# Patient Record
Sex: Female | Born: 1935 | Race: White | Hispanic: No | Smoking: Never smoker
Health system: Southern US, Community
[De-identification: ages and names within clinical notes are randomized; demographics above are authoritative.]

## PROBLEM LIST (undated history)

## (undated) DIAGNOSIS — F32A Depression, unspecified: Secondary | ICD-10-CM

## (undated) DIAGNOSIS — F419 Anxiety disorder, unspecified: Secondary | ICD-10-CM

## (undated) DIAGNOSIS — R296 Repeated falls: Secondary | ICD-10-CM

## (undated) DIAGNOSIS — F329 Major depressive disorder, single episode, unspecified: Secondary | ICD-10-CM

## (undated) DIAGNOSIS — F0280 Dementia in other diseases classified elsewhere without behavioral disturbance: Secondary | ICD-10-CM

## (undated) DIAGNOSIS — G43909 Migraine, unspecified, not intractable, without status migrainosus: Secondary | ICD-10-CM

## (undated) DIAGNOSIS — G309 Alzheimer's disease, unspecified: Secondary | ICD-10-CM

## (undated) HISTORY — DX: Anxiety disorder, unspecified: F41.9

## (undated) HISTORY — DX: Depression, unspecified: F32.A

## (undated) HISTORY — DX: Major depressive disorder, single episode, unspecified: F32.9

## (undated) HISTORY — DX: Migraine, unspecified, not intractable, without status migrainosus: G43.909

---

## 2005-03-15 ENCOUNTER — Ambulatory Visit: Payer: Self-pay | Admitting: Internal Medicine

## 2005-03-15 ENCOUNTER — Ambulatory Visit (HOSPITAL_COMMUNITY): Admission: RE | Admit: 2005-03-15 | Discharge: 2005-03-15 | Payer: Self-pay | Admitting: Internal Medicine

## 2012-01-17 DIAGNOSIS — R112 Nausea with vomiting, unspecified: Secondary | ICD-10-CM

## 2014-04-08 ENCOUNTER — Encounter: Payer: Self-pay | Admitting: Neurology

## 2014-04-08 ENCOUNTER — Encounter (INDEPENDENT_AMBULATORY_CARE_PROVIDER_SITE_OTHER): Payer: Self-pay

## 2014-04-08 ENCOUNTER — Encounter: Payer: Self-pay | Admitting: *Deleted

## 2014-04-08 ENCOUNTER — Ambulatory Visit (INDEPENDENT_AMBULATORY_CARE_PROVIDER_SITE_OTHER): Payer: Medicare Other | Admitting: Neurology

## 2014-04-08 VITALS — BP 156/82 | HR 64 | Temp 98.0°F | Ht 65.0 in | Wt 154.0 lb

## 2014-04-08 DIAGNOSIS — N390 Urinary tract infection, site not specified: Secondary | ICD-10-CM

## 2014-04-08 DIAGNOSIS — G4700 Insomnia, unspecified: Secondary | ICD-10-CM

## 2014-04-08 DIAGNOSIS — F0390 Unspecified dementia without behavioral disturbance: Secondary | ICD-10-CM

## 2014-04-08 NOTE — Patient Instructions (Addendum)
You have complaints of memory loss: memory loss. Conditions that can contribute to subjective or objective memory loss include: depression, stress, poor sleep from insomnia or sleep apnea, dehydration, fluctuation in blood sugar values, thyroid or electrolyte dysfunction. Dementia can be causes by stroke, brain atherosclerosis and by Alzheimer's disease or other, more rare and sometimes hereditary causes. We will do some additional testing: a brain scan and will continue with namenda 5 mg.

## 2014-04-08 NOTE — Progress Notes (Signed)
Subjective:    Patient ID: Veronica Hood is a 80 y.o. female.  HPI    Huston FoleySaima Mervin Ramires, MD, PhD [facility] [address], [address] [address] [place], [place] [address]  Dear Chrissie NoaWilliam,   I saw your patient, Veronica PearDorothy Hood, upon your kind request in my neurologic clinic today for initial consultation of her memory loss. The patient is accompanied by her son and her granddaughter today. As you know, Veronica Hood is a 80 year old right-handed woman with an underlying medical history of allergic rhinitis, thyroid disease, edema, hyperlipidemia, sinusitis, vitamin D deficiency, who has had memory loss for the past 4 years. She was seen in clinic by you on 04/02/2014 at which time he started her on Namenda. She was also complaining of depression at the time. She tried Aricept about a year or 18 months ago and she had N/V, so it was stopped after one dose.  She is widowed since 2009 or 2010. Her son lives nearby. She moved in with her GD Denny Peonrin about a week ago, because of wandering. Blood work from 09/11/2013 included lipid panel which showed an LDL of 1:15, total cholesterol of 188, creatinine of 1.03, sodium of 146, ALT and AST normal. She is currently treated for a UTI.  She does not smoke. She does not drink water, in fact, drinks sweet tea and cola. She has not driven a car since 8/15.  She does not sleep well.  In the last month, she has had visual hallucinations and does not like to be alone.   She primarily has been having difficulty with short-term memory such as forgetfulness, misplacing things, asking the same question again and forgetting dates and events. There is report of confusion or disorientation. Familiar faces are easily recognized, but some long term memory issues are also reported.  She reports a FHx of AD in her father, 2 sisters and 2 brothers.  She has one son and one GD.   Her Past Medical History Is Significant For: Past Medical History  Diagnosis Date   . Migraines   . Depression   . Anxiety     Her Past Surgical History Is Significant For: No past surgical history on file.  Her Family History Is Significant For: Family History  Problem Relation Age of Onset  . Alzheimer's disease Father     Her Social History Is Significant For: History   Social History  . Marital Status: Single    Spouse Name: N/A    Number of Children: 1  . Years of Education: college   Occupational History  .      retired   Social History Main Topics  . Smoking status: Never Smoker   . Smokeless tobacco: Never Used  . Alcohol Use: No  . Drug Use: No  . Sexual Activity: None   Other Topics Concern  . None   Social History Narrative  . None    Her Allergies Are:  Allergies  Allergen Reactions  . Penicillins     swelling  :   Her Current Medications Are:  Outpatient Encounter Prescriptions as of 04/08/2014  Medication Sig  . ALPRAZolam (XANAX) 0.5 MG tablet Take 0.5 mg by mouth 3 (three) times daily as needed for anxiety. At bedtime  . ciprofloxacin (CIPRO) 500 MG/5ML (10%) suspension Take 500 mg by mouth 2 (two) times daily.  Marland Kitchen. escitalopram (LEXAPRO) 10 MG tablet Take 10 mg by mouth daily.  . memantine (NAMENDA) 5 MG tablet Take 5 mg by  mouth daily.  :  Review of Systems:  Out of a complete 14 point review of systems, all are reviewed and negative with the exception of these symptoms as listed below:    Review of Systems  HENT:       Ringing in ears  Cardiovascular: Positive for leg swelling.  Musculoskeletal:       Cramps  Neurological:       Memory loss, confusion, headache, insomnia, sleepiness, dizziness  Psychiatric/Behavioral:       Depression, anxiety, not enough sleep, suicidal thoughts, hallucinations, racing thoughts    Objective:  Neurologic Exam  Physical Exam Physical Examination:   Filed Vitals:   04/08/14 1122  BP: 156/82  Pulse: 64  Temp: 98 F (36.7 C)    General Examination: The patient is a  very pleasant 80 y.o. female in no acute distress. She is calm and cooperative with the exam. She denies Auditory Hallucinations and Visual Hallucinations. She is well groomed and situated in a chair.   HEENT: Normocephalic, atraumatic, pupils are equal, round and reactive to light and accommodation. Funduscopic exam is normal with sharp disc margins noted. Extraocular tracking shows mild saccadic breakdown without nystagmus noted. Hearing is impaired mildly. Tympanic membranes are clear bilaterally. Face is symmetric with no facial masking and normal facial sensation. There is no lip, neck or jaw tremor. Neck is not rigid with intact passive ROM. There are no carotid bruits on auscultation. Oropharynx exam reveals moderate mouth dryness. No significant airway crowding is noted. Mallampati is class III. Tongue protrudes centrally and palate elevates symmetrically.    Chest: is clear to auscultation without wheezing, rhonchi or crackles noted.  Heart: sounds are regular and normal without murmurs, rubs or gallops noted.   Abdomen: is soft, non-tender and non-distended with normal bowel sounds appreciated on auscultation.  Extremities: There is no pitting edema in the distal lower extremities bilaterally. Pedal pulses are intact.   Skin: is warm and dry with no trophic changes noted. Age-related changes are noted on the skin. Skin is very dry.   Musculoskeletal: exam reveals no obvious joint deformities, tenderness or joint swelling or erythema. Changes consistent with OA of the hands are noted bilaterally.   Neurologically:  Mental status: The patient is awake and alert, paying fair  attention. She is able to partially provide the history. Her family provides details. She is oriented to: person, situation, month of year and year. Her memory, attention, language and knowledge are impaired. There is no aphasia, agnosia, apraxia or anomia. There is a mild degree of bradyphrenia. Speech is mildly  hypophonic with no dysarthria noted. Mood is congruent and affect is blunted.  On 04/08/2014:  Her MMSE (Mini-Mental state exam) score is 24/30.  CDT (Clock Drawing Test) score is 2/4.  AFT (Animal Fluency Test) score is 11.  Geriatric Depression Scale Score is 2/15.   Cranial nerves are as described above under HEENT exam. In addition, shoulder shrug is normal with equal shoulder height noted.  Motor exam: Normal bulk, and strength for age is noted. Tone is not rigid with absence of cogwheeling in the  extremities. There is overall mild bradykinesia. There is no drift or rebound. There is no tremor.  Romberg is negative. Reflexes are 1+ in the upper extremities and trace in the lower extremities. Toes are downgoing bilaterally. Fine motor skills: Finger taps, hand movements, and rapid alternating patting are mildly impaired bilaterally. Foot taps and foot agility are mildly impaired bilaterally.   Cerebellar testing  shows no dysmetria or intention tremor on finger to nose testing. Heel to shin is unremarkable. There is no truncal or gait ataxia.   Sensory exam is intact to light touch, pinprick, vibration, temperature sense in the upper and lower extremities.   Gait, station and balance: She stands up from the seated position with mild difficulty and needs no assistance. No veering to one side is noted. No leaning to one side. Posture is age-appropriate. Stance is narrow-based. She turns in 3 steps. Tandem walk is not tested. Balance is not significantly impaired.   Assessment and Plan:   In summary, Veronica Hood is a very pleasant 80 y.o.-year old female  with an underlying medical history of allergic rhinitis, thyroid disease, edema, hyperlipidemia, sinusitis, vitamin D deficiency, who has had memory loss for the past 4 years. Her MMSE today is 24/30. She previously tried Aricept. She could not tolerate it but only took one pill as I understand. She may be able to try it again. As of right  now she is on Namenda 5 mg and has been able to tolerated. 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 She may have Alzheimer's disease. She had an MRI about a year and half ago at [facility]. We will try to get those records. We will repeat her brain MRI without contrast. I asked her to reduce her caffeine intake and be better hydrated with water. We talked about the diagnosis of memory loss and dementia, its prognosis and treatment options. Implications of diagnosis explained at length with the patient and caregivers. We talked about medical treatments and non-pharmacological approaches. We talked about maintaining a healthy lifestyle in general and staying active mentally and physically. I encouraged the patient to eat healthy, exercise daily and keep well hydrated, to keep a scheduled bedtime and wake time routine, to not skip any meals and eat healthy snacks in between meals and to have protein with every meal. I stressed the importance of regular exercise, within of course the patient's own mobility limitations. I encouraged the patient to keep up with current events by reading the news paper or watching the news and to do word puzzles, or if feasible, to go on [email or web address].   As far as further diagnostic testing is concerned, I suggested the following: no change. MRI brain without contrast. She has had routine blood work with you. Please make sure she is had a TSH and B12 level checked within the past year. As far as medications are concerned, I recommended the following at this time: I suggested she continue with Namenda 5 mg. She may be able to increase it to 10 mg soon. I do agree that she lives with someone for better supervision. I answered all their questions today and the patient and her family were in agreement with the above outlined plan. I would like to see the patient back in 3 months, sooner if the need arises and encouraged them to call with any interim questions,  concerns, problems, updates.   Thank you very much for allowing me to participate in the care of this nice patient. If I can be of any further assistance to you please do not hesitate to call me at [phone].  Sincerely,   Huston FoleySaima Madylin Fairbank, MD, PhD

## 2014-04-29 ENCOUNTER — Ambulatory Visit
Admission: RE | Admit: 2014-04-29 | Discharge: 2014-04-29 | Disposition: A | Discharge status: Home / Self Care | Payer: Medicare Other | Source: Ambulatory Visit | Attending: Neurology | Admitting: Neurology

## 2014-04-29 DIAGNOSIS — F0390 Unspecified dementia without behavioral disturbance: Secondary | ICD-10-CM

## 2014-04-29 DIAGNOSIS — G4700 Insomnia, unspecified: Secondary | ICD-10-CM

## 2014-04-29 DIAGNOSIS — N390 Urinary tract infection, site not specified: Secondary | ICD-10-CM

## 2014-05-01 NOTE — Progress Notes (Signed)
Quick Note:  Please call patient regarding the recent brain MRI: The brain scan showed a normal structure of the brain and mild volume loss which we call atrophy. There were changes in the deeper structures of the brain, which we call white matter changes or microvascular changes. These were reported as mild in Her case. These are tiny white spots, that occur with time and are seen in a variety of conditions, including with normal aging, chronic hypertension, chronic headaches, especially migraine HAs, chronic diabetes, chronic hyperlipidemia. These are not strokes and no mass or lesion or contrast enhancement was seen which is reassuring. Again, there were no acute findings, such as a stroke, or mass or blood products.  Overall, findings were felt to be age-appropriate by the reading physician, which is reassuring.   No further action is required on this test at this time, other than re-enforcing the importance of good blood pressure control, good cholesterol control, good blood sugar control, and weight management. Please remind patient to keep any upcoming appointments or tests and to call us with any interim questions, concerns, problems or updates. Thanks,  Huston FoleySaima Chavis Tessler, MD, PhD    ______

## 2014-05-02 ENCOUNTER — Telehealth: Payer: Self-pay | Admitting: Neurology

## 2014-05-02 DIAGNOSIS — F0390 Unspecified dementia without behavioral disturbance: Secondary | ICD-10-CM

## 2014-05-02 MED ORDER — MEMANTINE HCL ER 14 MG PO CP24: 14.0000 mg | ORAL_CAPSULE | Freq: Every day | ORAL | Status: DC

## 2014-05-02 NOTE — Telephone Encounter (Signed)
Veronica Hood (granddaughter) said they would like further testing done since MR Brain results were normal. She will put purse down and 5 sec. Later does not remember were it is. Does not remember her granddaughters are and they live with her.

## 2014-05-02 NOTE — Telephone Encounter (Signed)
Called granddaughter(Erin)but could not leave VM, left message with son that patient's Veronica Hood has been increased per Dr Teofilo PodAthar's note below, he verbalized understanding

## 2014-05-02 NOTE — Telephone Encounter (Signed)
We can go ahead and increase her Namenda. I would suggest Namenda XR 14 mg daily. Rx sent to pharmacy.

## 2014-07-09 ENCOUNTER — Telehealth: Payer: Self-pay | Admitting: Neurology

## 2014-07-09 ENCOUNTER — Ambulatory Visit: Payer: Medicare Other | Admitting: Neurology

## 2014-07-09 NOTE — Telephone Encounter (Signed)
Patient is a no show for today's appointment 07/09/14 

## 2015-07-29 ENCOUNTER — Other Ambulatory Visit (HOSPITAL_COMMUNITY)
Admission: RE | Admit: 2015-07-29 | Discharge: 2015-07-29 | Disposition: A | Discharge status: Home / Self Care | Payer: Medicare Other | Source: Skilled Nursing Facility | Attending: Family Medicine | Admitting: Family Medicine

## 2015-07-29 DIAGNOSIS — N390 Urinary tract infection, site not specified: Secondary | ICD-10-CM | POA: Insufficient documentation

## 2015-07-29 LAB — URINE MICROSCOPIC-ADD ON: RBC / HPF: NONE SEEN RBC/hpf (ref 0–5)

## 2015-07-29 LAB — URINALYSIS, ROUTINE W REFLEX MICROSCOPIC
Bilirubin Urine: NEGATIVE
GLUCOSE, UA: NEGATIVE mg/dL
HGB URINE DIPSTICK: NEGATIVE
Ketones, ur: NEGATIVE mg/dL
Nitrite: POSITIVE — AB
PROTEIN: NEGATIVE mg/dL
Specific Gravity, Urine: 1.015 (ref 1.005–1.030)
pH: 6 (ref 5.0–8.0)

## 2015-08-01 LAB — URINE CULTURE: Culture: >=100000

## 2015-10-19 ENCOUNTER — Other Ambulatory Visit (HOSPITAL_COMMUNITY)
Admission: RE | Admit: 2015-10-19 | Discharge: 2015-10-19 | Disposition: A | Discharge status: Home / Self Care | Payer: Medicare Other | Source: Skilled Nursing Facility | Attending: *Deleted | Admitting: *Deleted

## 2015-10-19 DIAGNOSIS — N390 Urinary tract infection, site not specified: Secondary | ICD-10-CM | POA: Diagnosis present

## 2015-10-19 LAB — URINE MICROSCOPIC-ADD ON: RBC / HPF: NONE SEEN RBC/hpf (ref 0–5)

## 2015-10-19 LAB — URINALYSIS, ROUTINE W REFLEX MICROSCOPIC
BILIRUBIN URINE: NEGATIVE
GLUCOSE, UA: NEGATIVE mg/dL
HGB URINE DIPSTICK: NEGATIVE
Ketones, ur: NEGATIVE mg/dL
Nitrite: NEGATIVE
PROTEIN: NEGATIVE mg/dL
SPECIFIC GRAVITY, URINE: 1.02 (ref 1.005–1.030)
pH: 5.5 (ref 5.0–8.0)

## 2015-10-22 LAB — URINE CULTURE

## 2016-02-11 ENCOUNTER — Other Ambulatory Visit: Payer: Self-pay

## 2016-02-11 ENCOUNTER — Emergency Department (HOSPITAL_COMMUNITY): Payer: Medicare Other

## 2016-02-11 ENCOUNTER — Observation Stay (HOSPITAL_COMMUNITY)
Admission: EM | Admit: 2016-02-11 | Discharge: 2016-02-12 | Disposition: A | Discharge status: Home / Self Care | Payer: Medicare Other | Attending: Internal Medicine | Admitting: Internal Medicine

## 2016-02-11 ENCOUNTER — Encounter (HOSPITAL_COMMUNITY): Payer: Self-pay

## 2016-02-11 DIAGNOSIS — Y92129 Unspecified place in nursing home as the place of occurrence of the external cause: Secondary | ICD-10-CM | POA: Diagnosis not present

## 2016-02-11 DIAGNOSIS — J189 Pneumonia, unspecified organism: Secondary | ICD-10-CM | POA: Diagnosis present

## 2016-02-11 DIAGNOSIS — S0083XA Contusion of other part of head, initial encounter: Secondary | ICD-10-CM | POA: Diagnosis not present

## 2016-02-11 DIAGNOSIS — R0902 Hypoxemia: Secondary | ICD-10-CM

## 2016-02-11 DIAGNOSIS — R4689 Other symptoms and signs involving appearance and behavior: Secondary | ICD-10-CM | POA: Diagnosis present

## 2016-02-11 DIAGNOSIS — Y998 Other external cause status: Secondary | ICD-10-CM | POA: Insufficient documentation

## 2016-02-11 DIAGNOSIS — W19XXXA Unspecified fall, initial encounter: Secondary | ICD-10-CM | POA: Insufficient documentation

## 2016-02-11 DIAGNOSIS — Z79899 Other long term (current) drug therapy: Secondary | ICD-10-CM | POA: Diagnosis not present

## 2016-02-11 DIAGNOSIS — R4182 Altered mental status, unspecified: Secondary | ICD-10-CM | POA: Diagnosis not present

## 2016-02-11 DIAGNOSIS — Y939 Activity, unspecified: Secondary | ICD-10-CM | POA: Insufficient documentation

## 2016-02-11 HISTORY — DX: Dementia in other diseases classified elsewhere, unspecified severity, without behavioral disturbance, psychotic disturbance, mood disturbance, and anxiety: F02.80

## 2016-02-11 HISTORY — DX: Alzheimer's disease, unspecified: G30.9

## 2016-02-11 LAB — COMPREHENSIVE METABOLIC PANEL
ALT: 14 U/L (ref 14–54)
AST: 20 U/L (ref 15–41)
Albumin: 3.9 g/dL (ref 3.5–5.0)
Alkaline Phosphatase: 60 U/L (ref 38–126)
Anion gap: 7 (ref 5–15)
BUN: 22 mg/dL — ABNORMAL HIGH (ref 6–20)
CHLORIDE: 109 mmol/L (ref 101–111)
CO2: 22 mmol/L (ref 22–32)
CREATININE: 1.12 mg/dL — AB (ref 0.44–1.00)
Calcium: 8.6 mg/dL — ABNORMAL LOW (ref 8.9–10.3)
GFR calc non Af Amer: 44 mL/min — ABNORMAL LOW (ref >60)
GFR, EST AFRICAN AMERICAN: 51 mL/min — AB (ref >60)
Glucose, Bld: 107 mg/dL — ABNORMAL HIGH (ref 65–99)
Potassium: 3.6 mmol/L (ref 3.5–5.1)
SODIUM: 138 mmol/L (ref 135–145)
Total Bilirubin: 0.7 mg/dL (ref 0.3–1.2)
Total Protein: 5.8 g/dL — ABNORMAL LOW (ref 6.5–8.1)

## 2016-02-11 LAB — URINALYSIS, ROUTINE W REFLEX MICROSCOPIC
BILIRUBIN URINE: NEGATIVE
Glucose, UA: NEGATIVE mg/dL
Hgb urine dipstick: NEGATIVE
Leukocytes, UA: NEGATIVE
NITRITE: NEGATIVE
PH: 5.5 (ref 5.0–8.0)
Protein, ur: NEGATIVE mg/dL
SPECIFIC GRAVITY, URINE: 1.02 (ref 1.005–1.030)

## 2016-02-11 LAB — CBC WITH DIFFERENTIAL/PLATELET
BASOS PCT: 0 %
Basophils Absolute: 0 10*3/uL (ref 0.0–0.1)
EOS ABS: 0.1 10*3/uL (ref 0.0–0.7)
Eosinophils Relative: 1 %
HCT: 33.9 % — ABNORMAL LOW (ref 36.0–46.0)
Hemoglobin: 11.4 g/dL — ABNORMAL LOW (ref 12.0–15.0)
Lymphocytes Relative: 56 %
Lymphs Abs: 6.3 10*3/uL — ABNORMAL HIGH (ref 0.7–4.0)
MCH: 31.2 pg (ref 26.0–34.0)
MCHC: 33.6 g/dL (ref 30.0–36.0)
MCV: 92.9 fL (ref 78.0–100.0)
Monocytes Absolute: 0.6 10*3/uL (ref 0.1–1.0)
Monocytes Relative: 5 %
NEUTROS PCT: 37 %
Neutro Abs: 4.2 10*3/uL (ref 1.7–7.7)
PLATELETS: 185 10*3/uL (ref 150–400)
RBC: 3.65 MIL/uL — AB (ref 3.87–5.11)
RDW: 14.5 % (ref 11.5–15.5)
WBC: 11.3 10*3/uL — AB (ref 4.0–10.5)

## 2016-02-11 MED ORDER — SODIUM CHLORIDE 0.9 % IV BOLUS (SEPSIS)
1000.0000 mL | Freq: Once | INTRAVENOUS | Status: AC
  Administered 2016-02-11: 1000 mL via INTRAVENOUS

## 2016-02-11 NOTE — ED Notes (Addendum)
Patient's son Veronica Hood called and requested and update on patient. States to call him if needed. Number is [phone]. Son states patient had bruising to left eye and forehead on Monday when he was visiting patient at [facility]. States he was told by staff that patient had fallen.

## 2016-02-11 NOTE — ED Provider Notes (Signed)
[facility]-EMERGENCY DEPT Provider Note   CSN: [id number] Arrival date & time: 02/11/16  2032  By signing my name below, I, Christy SartoriusAnastasia Kolousek, attest that this documentation has been prepared under the direction and in the presence of Bethann BerkshireJoseph Annaclaire Walsworth, MD . Electronically Signed: Christy SartoriusAnastasia Kolousek, Scribe. 02/11/2016. 9:12 PM.  History   Chief Complaint Chief Complaint  Patient presents with  . Fall   Level 5 Caveat: Altered Mental Status   Patient was sent over from the nursing home because she has been acting out for the last week or so. Patient was given a lot of Ativan today and was sent over here for evaluation. Patient had a fall 2 days ago   The history is provided by the patient. The history is limited by the condition of the patient. No language interpreter was used.  Fall  This is a new problem. The current episode started 2 days ago. The problem occurs rarely. The problem has been resolved. Nothing aggravates the symptoms. Nothing relieves the symptoms. She has tried nothing for the symptoms.   HPI Comments:  Veronica Hood is a 82 y.o. female who presents to the Emergency Department complaining of behavioral issues.  Her behavioral issues have continued at the same time every day for 2 weeks.  She was given lorazepam PRN as well as her regular dose.  The caretakers state that they didn't notice the bruising on her face until today and deny known trauma.  Pt fell earlier today; no head injury was reported.   Past Medical History:  Diagnosis Date  . Anxiety   . Depression   . Migraines     There are no active problems to display for this patient.   History reviewed. No pertinent surgical history.  OB History    No data available       Home Medications    Prior to Admission medications   Medication Sig Start Date End Date Taking? Authorizing Provider  ALPRAZolam Prudy Feeler(XANAX) 0.5 MG tablet Take 0.5 mg by mouth 3 (three) times daily as needed for anxiety. At bedtime     Historical Provider, MD  ciprofloxacin (CIPRO) 500 MG/5ML (10%) suspension Take 500 mg by mouth 2 (two) times daily.    Historical Provider, MD  escitalopram (LEXAPRO) 10 MG tablet Take 10 mg by mouth daily.    Historical Provider, MD  memantine (NAMENDA) 5 MG tablet Take 5 mg by mouth daily.    Historical Provider, MD  Memantine HCl ER (NAMENDA XR) 14 MG CP24 Take 1 tablet (14 mg total) by mouth daily. 05/02/14   Huston FoleySaima Athar, MD    Family History Family History  Problem Relation Age of Onset  . Alzheimer's disease Father     Social History Social History  Substance Use Topics  . Smoking status: Never Smoker  . Smokeless tobacco: Never Used  . Alcohol use No     Allergies   Penicillins   Review of Systems Review of Systems  Unable to perform ROS: Mental status change     Physical Exam Updated Vital Signs BP 126/64 (BP Location: Left Arm)   Pulse 68   Temp 97.4 F (36.3 C) (Axillary)   Resp 20   Wt 154 lb (69.9 kg)   SpO2 100%   BMI 25.63 kg/m   Physical Exam  Constitutional: She appears well-developed.  Responds to painful stimuli.  Very lethargic.    HENT:  Head: Normocephalic.  Eyes: Conjunctivae and EOM are normal. No scleral icterus.  Neck: Neck  supple. No tracheal deviation present. No thyromegaly present.  Cardiovascular: Normal rate and regular rhythm.  Exam reveals no gallop and no friction rub.   No murmur heard. Pulmonary/Chest: No stridor. She has no wheezes. She has no rales. She exhibits no tenderness.  Abdominal: She exhibits no distension. There is no tenderness. There is no rebound.  Musculoskeletal: Normal range of motion. She exhibits no edema.  Lymphadenopathy:    She has no cervical adenopathy.  Neurological: She exhibits normal muscle tone. Coordination normal.  Skin: Skin is warm. No rash noted. No erythema.  Bruising on her left eye and forehead.   Psychiatric: She has a normal mood and affect. Her behavior is normal.     ED  Treatments / Results   DIAGNOSTIC STUDIES:  Oxygen Saturation is 100% on RA, nml by my interpretation.    COORDINATION OF CARE:  9:12 PM Discussed treatment plan with pt at bedside and pt agreed to plan.  Labs (all labs ordered are listed, but only abnormal results are displayed) Labs Reviewed  CBC WITH DIFFERENTIAL/PLATELET  COMPREHENSIVE METABOLIC PANEL  URINALYSIS, ROUTINE W REFLEX MICROSCOPIC (NOT AT [facility])    EKG  EKG Interpretation None       Radiology No results found.  Procedures Procedures (including critical care time)  Medications Ordered in ED Medications  sodium chloride 0.9 % bolus 1,000 mL (not administered)     Initial Impression / Assessment and Plan / ED Course  I have reviewed the triage vital signs and the nursing notes.  Pertinent labs & imaging results that were available during my care of the patient were reviewed by me and considered in my medical decision making (see chart for details).  Clinical Course   Patient with confusion and bruising to her head. She will go to [facility] to get a CT scan of her head neck and face.  If the CT scan is negative for trauma she will return [facility] department [facility] to be admitted for altered mental status most likely secondary to Ativan   Final Clinical Impressions(s) / ED Diagnoses   Final diagnoses:  None    New Prescriptions New Prescriptions   No medications on file       Bethann BerkshireJoseph Mahaley Schwering, MD 02/11/16 2335
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
## 2016-02-11 NOTE — ED Notes (Signed)
[facility] at [facility] to [facility] to [facility] for [facility] scan.

## 2016-02-11 NOTE — ED Triage Notes (Signed)
Pt is resident of [facility] where pt apparently fell yesterday, has bruising to her face and pain in her back.  Ems was called out for "combative" pt. Pt was cooperative for ems

## 2016-02-12 ENCOUNTER — Emergency Department (HOSPITAL_COMMUNITY): Payer: Medicare Other

## 2016-02-12 ENCOUNTER — Ambulatory Visit (HOSPITAL_COMMUNITY)
Admission: RE | Admit: 2016-02-12 | Discharge: 2016-02-12 | Disposition: A | Discharge status: Home / Self Care | Payer: Medicare Other | Source: Ambulatory Visit | Attending: Emergency Medicine | Admitting: Emergency Medicine

## 2016-02-12 ENCOUNTER — Encounter (HOSPITAL_COMMUNITY): Payer: Self-pay | Admitting: Emergency Medicine

## 2016-02-12 DIAGNOSIS — J189 Pneumonia, unspecified organism: Secondary | ICD-10-CM | POA: Diagnosis not present

## 2016-02-12 DIAGNOSIS — R4182 Altered mental status, unspecified: Secondary | ICD-10-CM

## 2016-02-12 LAB — CBC
HCT: 32.3 % — ABNORMAL LOW (ref 36.0–46.0)
HEMOGLOBIN: 10.9 g/dL — AB (ref 12.0–15.0)
MCH: 31.4 pg (ref 26.0–34.0)
MCHC: 33.7 g/dL (ref 30.0–36.0)
MCV: 93.1 fL (ref 78.0–100.0)
PLATELETS: 174 10*3/uL (ref 150–400)
RBC: 3.47 MIL/uL — AB (ref 3.87–5.11)
RDW: 14.5 % (ref 11.5–15.5)
WBC: 9.3 10*3/uL (ref 4.0–10.5)

## 2016-02-12 LAB — COMPREHENSIVE METABOLIC PANEL
ALBUMIN: 3.5 g/dL (ref 3.5–5.0)
ALK PHOS: 52 U/L (ref 38–126)
ALT: 13 U/L — AB (ref 14–54)
ANION GAP: 5 (ref 5–15)
AST: 18 U/L (ref 15–41)
BUN: 19 mg/dL (ref 6–20)
CHLORIDE: 112 mmol/L — AB (ref 101–111)
CO2: 24 mmol/L (ref 22–32)
Calcium: 8.5 mg/dL — ABNORMAL LOW (ref 8.9–10.3)
Creatinine, Ser: 0.93 mg/dL (ref 0.44–1.00)
GFR calc non Af Amer: 55 mL/min — ABNORMAL LOW (ref >60)
GLUCOSE: 93 mg/dL (ref 65–99)
Potassium: 3.4 mmol/L — ABNORMAL LOW (ref 3.5–5.1)
SODIUM: 141 mmol/L (ref 135–145)
Total Bilirubin: 0.6 mg/dL (ref 0.3–1.2)
Total Protein: 5.1 g/dL — ABNORMAL LOW (ref 6.5–8.1)

## 2016-02-12 LAB — MRSA PCR SCREENING: MRSA by PCR: NEGATIVE

## 2016-02-12 LAB — CBG MONITORING, ED: GLUCOSE-CAPILLARY: 93 mg/dL (ref 65–99)

## 2016-02-12 MED ORDER — FAMOTIDINE 20 MG PO TABS
20.0000 mg | ORAL_TABLET | Freq: Every day | ORAL | Status: DC
  Filled 2016-02-12: qty 1

## 2016-02-12 MED ORDER — DOCUSATE SODIUM 100 MG PO CAPS
100.0000 mg | ORAL_CAPSULE | Freq: Two times a day (BID) | ORAL | Status: DC
  Filled 2016-02-12: qty 1

## 2016-02-12 MED ORDER — QUETIAPINE FUMARATE 25 MG PO TABS: 50.0000 mg | ORAL_TABLET | Freq: Every day | ORAL | Status: DC

## 2016-02-12 MED ORDER — SODIUM CHLORIDE 0.9 % IV SOLN
INTRAVENOUS | Status: DC
  Administered 2016-02-12: 03:00:00 via INTRAVENOUS

## 2016-02-12 MED ORDER — DEXTROSE 5 % IV SOLN
2.0000 g | Freq: Three times a day (TID) | INTRAVENOUS | Status: DC
  Administered 2016-02-12: 2 g via INTRAVENOUS
  Filled 2016-02-12 (×11): qty 2

## 2016-02-12 MED ORDER — ENOXAPARIN SODIUM 40 MG/0.4ML ~~LOC~~ SOLN
40.0000 mg | SUBCUTANEOUS | Status: DC
  Filled 2016-02-12: qty 0.4

## 2016-02-12 MED ORDER — ESCITALOPRAM OXALATE 10 MG PO TABS
10.0000 mg | ORAL_TABLET | Freq: Every day | ORAL | Status: DC
  Filled 2016-02-12: qty 1

## 2016-02-12 MED ORDER — ACETAMINOPHEN 500 MG PO TABS
1000.0000 mg | ORAL_TABLET | Freq: Two times a day (BID) | ORAL | Status: DC
  Filled 2016-02-12: qty 2

## 2016-02-12 MED ORDER — BENZONATATE 100 MG PO CAPS: 100.0000 mg | ORAL_CAPSULE | Freq: Two times a day (BID) | ORAL | Status: DC | PRN

## 2016-02-12 MED ORDER — DEXTROSE 5 % IV SOLN
INTRAVENOUS | Status: AC
  Filled 2016-02-12: qty 2

## 2016-02-12 MED ORDER — FAMOTIDINE 20 MG PO TABS: 20.0000 mg | ORAL_TABLET | Freq: Two times a day (BID) | ORAL | Status: DC

## 2016-02-12 MED ORDER — GABAPENTIN 100 MG PO CAPS
100.0000 mg | ORAL_CAPSULE | Freq: Two times a day (BID) | ORAL | Status: DC
  Filled 2016-02-12: qty 1

## 2016-02-12 MED ORDER — VANCOMYCIN HCL IN DEXTROSE 1-5 GM/200ML-% IV SOLN
1000.0000 mg | Freq: Once | INTRAVENOUS | Status: AC
  Administered 2016-02-12: 1000 mg via INTRAVENOUS
  Filled 2016-02-12: qty 200

## 2016-02-12 MED ORDER — VANCOMYCIN HCL IN DEXTROSE 750-5 MG/150ML-% IV SOLN
750.0000 mg | Freq: Two times a day (BID) | INTRAVENOUS | Status: DC
  Filled 2016-02-12 (×6): qty 150

## 2016-02-12 MED ORDER — CETYLPYRIDINIUM CHLORIDE 0.05 % MT LIQD: 7.0000 mL | Freq: Two times a day (BID) | OROMUCOSAL | Status: DC

## 2016-02-12 NOTE — H&P (Signed)
TRH H&P   Patient Demographics:    Veronica Hood, is a 82 y.o. female  MRN: [id number]  DOB - 07/11/1929  Admit Date - 02/11/2016  Outpatient Primary MD for the patient is No PCP Per Patient  Referring MD/NP/PA: Dr. Elesa Massed  Patient coming from: Skilled facility  Chief Complaint  Patient presents with  . Fall      HPI:    Veronica Hood  is a 82 y.o. female, Was sent to ED from skilled facility due to altered mental status. Patient has history of dementia and has been having behavior issues and his care facility where she was given lorazepam when necessary in addition to her regular dose. Patient apparently fell at the facility, and was sent to [facility]. CT head, cervical spine and maxillofacial without contrast which were all negative for acute abnormality. Chest x-ray showed linear and patchy opacity, may be atelectasis, pneumonia. Patient becomes hypoxic on sleeping, currently on oxygen via nonrebreather.    Review of systems:    Review of systems is not obtainable due to patient's altered mental status  A full 10 point Review of Systems was done, except as stated above, all other Review of Systems were negative.   With Past History of the following :    Past Medical History:  Diagnosis Date  . Anxiety   . Depression   . Migraines       History reviewed. No pertinent surgical history.    Social History:     Social History  Substance Use Topics  . Smoking status: Never Smoker  . Smokeless tobacco: Never Used  . Alcohol use No        Family History :     Family History  Problem Relation Age of Onset  . Alzheimer's disease Father       Home Medications:   Prior to Admission medications   Medication Sig Start Date End Date Taking? Authorizing Provider  acetaminophen (TYLENOL) 500 MG tablet Take 1,000 mg by mouth 2 (two) times daily.   Yes Historical  Provider, MD  benzonatate (TESSALON) 100 MG capsule Take 100 mg by mouth 2 (two) times daily as needed for cough.   Yes Historical Provider, MD  cholecalciferol (VITAMIN D) 1000 units tablet Take 1,000 Units by mouth daily.   Yes Historical Provider, MD  Cranberry 450 MG TABS Take 1 tablet by mouth daily.   Yes Historical Provider, MD  docusate sodium (COLACE) 100 MG capsule Take 100 mg by mouth 2 (two) times daily.   Yes Historical Provider, MD  escitalopram (LEXAPRO) 20 MG tablet Take 10 mg by mouth daily.   Yes Historical Provider, MD  gabapentin (NEURONTIN) 100 MG capsule Take 100 mg by mouth 2 (two) times daily.   Yes Historical Provider, MD  ibuprofen (ADVIL,MOTRIN) 600 MG tablet Take 600 mg by mouth daily as needed for headache.   Yes Historical Provider, MD  LORazepam (ATIVAN) 0.5 MG tablet Take 0.5 mg by mouth 3 (three) times daily. *May take  one-half tablet twice daily as needed for anxiety   Yes Historical Provider, MD  Melatonin 3 MG TABS Take 1 tablet by mouth at bedtime.   Yes Historical Provider, MD  Phenylephrine-DM-GG (ROBITUSSIN COUGH/COLD CF) 5-10-100 MG/5ML LIQD Take 20 mLs by mouth 3 (three) times daily as needed (for cough).   Yes Historical Provider, MD  QUEtiapine (SEROQUEL) 50 MG tablet Take 50 mg by mouth at bedtime.   Yes Historical Provider, MD  ranitidine (ZANTAC) 150 MG tablet Take 150 mg by mouth 2 (two) times daily.   Yes Historical Provider, MD  traZODone (DESYREL) 50 MG tablet Take 25 mg by mouth at bedtime.   Yes Historical Provider, MD  ciprofloxacin (CIPRO) 500 MG/5ML (10%) suspension Take 500 mg by mouth 2 (two) times daily.    Historical Provider, MD     Allergies:     Allergies  Allergen Reactions  . Sulfa Antibiotics Other (See Comments)    Lesions in her mouth  . Penicillins     swelling     Physical Exam:   Vitals  Blood pressure 140/70, pulse (!) 59, temperature (!) 96.1 F (35.6 C), temperature source Rectal, resp. rate 15, weight 69.9 kg  (154 lb), SpO2 97 %.   1. General Elderly female lying in bed, lethargic  2. Somnolent, opens eyes to verbal stimuli  3. No F.N deficits, ALL C.Nerves Intact, Strength 5/5 all 4 extremities, Sensation intact all 4 extremities, Plantars down going.  4. Ears and Eyes appear Normal, Conjunctivae clear, PERRLA. Moist Oral Mucosa.  5. Supple Neck, No JVD, No cervical lymphadenopathy appriciated, No Carotid Bruits.  6. Symmetrical Chest wall movement, Good air movement bilaterally, CTAB.  7. RRR, No Gallops, Rubs or Murmurs, No Parasternal Heave.No Leg edema  8. Positive Bowel Sounds, Abdomen Soft, No tenderness, No organomegaly appriciated,No rebound -guarding or rigidity.  9.  No Cyanosis, Normal Skin Turgor, No Skin Rash or Bruise.  10. Good muscle tone,  joints appear normal , no effusions, Normal ROM.      Data Review:    CBC  Recent Labs Lab 02/11/16 2210  WBC 11.3*  HGB 11.4*  HCT 33.9*  PLT 185  MCV 92.9  MCH 31.2  MCHC 33.6  RDW 14.5  LYMPHSABS 6.3*  MONOABS 0.6  EOSABS 0.1  BASOSABS 0.0   ------------------------------------------------------------------------------------------------------------------  Chemistries   Recent Labs Lab 02/11/16 2210  NA 138  K 3.6  CL 109  CO2 22  GLUCOSE 107*  BUN 22*  CREATININE 1.12*  CALCIUM 8.6*  AST 20  ALT 14  ALKPHOS 60  BILITOT 0.7   ------------------------------------------------------------------------------------------------------------------  ------------------------------------------------------------------------------------------------------------------ GFR: CrCl cannot be calculated (Unknown ideal weight.). Liver Function Tests:  Recent Labs Lab 02/11/16 2210  AST 20  ALT 14  ALKPHOS 60  BILITOT 0.7  PROT 5.8*  ALBUMIN 3.9   CBG:  Recent Labs Lab 02/12/16 0225  GLUCAP 93   Lipid  Profile:  --------------------------------------------------------------------------------------------------------------- Urine analysis:    Component Value Date/Time   COLORURINE YELLOW 02/11/2016 2224   APPEARANCEUR CLEAR 02/11/2016 2224   LABSPEC 1.020 02/11/2016 2224   PHURINE 5.5 02/11/2016 2224   GLUCOSEU NEGATIVE 02/11/2016 2224   HGBUR NEGATIVE 02/11/2016 2224   BILIRUBINUR NEGATIVE 02/11/2016 2224   KETONESUR TRACE (A) 02/11/2016 2224   PROTEINUR NEGATIVE 02/11/2016 2224   NITRITE NEGATIVE 02/11/2016 2224   LEUKOCYTESUR NEGATIVE 02/11/2016 2224      ----------------------------------------------------------------------------------------------------------------   Imaging Results:    Ct Head Wo Contrast  Result Date: 02/12/2016  CLINICAL DATA:  Fall with facial bruising in altered mental status. EXAM: CT HEAD WITHOUT CONTRAST CT MAXILLOFACIAL WITHOUT CONTRAST CT CERVICAL SPINE WITHOUT CONTRAST TECHNIQUE: Multidetector CT imaging of the head, cervical spine, and maxillofacial structures were performed using the standard protocol without intravenous contrast. Multiplanar CT image reconstructions of the cervical spine and maxillofacial structures were also generated. COMPARISON:  Head CT 12/29/2013 FINDINGS: CT HEAD FINDINGS No intracranial hemorrhage, mass effect, or midline shift. Generalized atrophy which is progressed from 2015 exam. Chronic small vessel ischemia also progressed. No hydrocephalus. The basilar cisterns are patent. No evidence of territorial infarct. No intracranial fluid collection. Atherosclerosis of skullbase vasculature. No calvarial fracture. Calvarium is intact. The mastoid air cells are well aerated. CT MAXILLOFACIAL FINDINGS No facial bone fracture. The orbits and globes are intact. The nasal bone, mandibles, zygomatic arches and pterygoid plates are intact. Paranasal sinuses are well-aerated without fluid level. Periodontal disease with full missing teeth.  Periapical lucency about right lower cuspid. No radiopaque foreign body or localizing soft tissue abnormality. CT CERVICAL SPINE FINDINGS No fracture or acute subluxation. The dens is intact. There are no jumped or perched facets. Vertebral body heights are preserved. There is multilevel degenerative change. Degenerative disc disease with disc space narrowing and endplate spurring N5-A2 through C6-C7. Facet arthropathy throughout. Minimal anterolisthesis of C3 on C4 and C4 on C5 appears degenerative. No prevertebral soft tissue edema. IMPRESSION: 1. No acute intracranial abnormality or calvarial fracture. Progressive atrophy and chronic small vessel ischemia from 2015 exam. 2. No acute facial bone fracture.  Periodontal disease. 3. Degenerative change throughout cervical spine without acute fracture or subluxation. Electronically Signed   By: Rubye Oaks M.D.   On: 02/12/2016 02:11   Ct Cervical Spine Wo Contrast  Result Date: 02/12/2016 CLINICAL DATA:  Fall with facial bruising in altered mental status. EXAM: CT HEAD WITHOUT CONTRAST CT MAXILLOFACIAL WITHOUT CONTRAST CT CERVICAL SPINE WITHOUT CONTRAST TECHNIQUE: Multidetector CT imaging of the head, cervical spine, and maxillofacial structures were performed using the standard protocol without intravenous contrast. Multiplanar CT image reconstructions of the cervical spine and maxillofacial structures were also generated. COMPARISON:  Head CT 12/29/2013 FINDINGS: CT HEAD FINDINGS No intracranial hemorrhage, mass effect, or midline shift. Generalized atrophy which is progressed from 2015 exam. Chronic small vessel ischemia also progressed. No hydrocephalus. The basilar cisterns are patent. No evidence of territorial infarct. No intracranial fluid collection. Atherosclerosis of skullbase vasculature. No calvarial fracture. Calvarium is intact. The mastoid air cells are well aerated. CT MAXILLOFACIAL FINDINGS No facial bone fracture. The orbits and globes are  intact. The nasal bone, mandibles, zygomatic arches and pterygoid plates are intact. Paranasal sinuses are well-aerated without fluid level. Periodontal disease with full missing teeth. Periapical lucency about right lower cuspid. No radiopaque foreign body or localizing soft tissue abnormality. CT CERVICAL SPINE FINDINGS No fracture or acute subluxation. The dens is intact. There are no jumped or perched facets. Vertebral body heights are preserved. There is multilevel degenerative change. Degenerative disc disease with disc space narrowing and endplate spurring Z3-Y8 through C6-C7. Facet arthropathy throughout. Minimal anterolisthesis of C3 on C4 and C4 on C5 appears degenerative. No prevertebral soft tissue edema. IMPRESSION: 1. No acute intracranial abnormality or calvarial fracture. Progressive atrophy and chronic small vessel ischemia from 2015 exam. 2. No acute facial bone fracture.  Periodontal disease. 3. Degenerative change throughout cervical spine without acute fracture or subluxation. Electronically Signed   By: Rubye Oaks M.D.   On: 02/12/2016 02:11   Dg  Chest Portable 1 View  Result Date: 02/11/2016 CLINICAL DATA:  Altered mental status. EXAM: PORTABLE CHEST 1 VIEW COMPARISON:  Radiograph 05/26/2014 FINDINGS: Lung volumes are low. Heart size and mediastinal contours are unchanged allowing for differences in technique. There is tortuosity and atherosclerosis of thoracic aorta. Linear and patchy right lung base opacity. Minimal left infrahilar atelectasis. No pleural effusion or pneumothorax. Calcified breast implants are noted. IMPRESSION: 1. Linear and patchy right basilar opacity, may be atelectasis, pneumonia, or aspiration. 2. Tortuosity and atherosclerosis of the thoracic aorta. Electronically Signed   By: Rubye Oaks M.D.   On: 02/11/2016 22:19   Ct Maxillofacial Wo Contrast  Result Date: 02/12/2016 CLINICAL DATA:  Fall with facial bruising in altered mental status. EXAM: CT  HEAD WITHOUT CONTRAST CT MAXILLOFACIAL WITHOUT CONTRAST CT CERVICAL SPINE WITHOUT CONTRAST TECHNIQUE: Multidetector CT imaging of the head, cervical spine, and maxillofacial structures were performed using the standard protocol without intravenous contrast. Multiplanar CT image reconstructions of the cervical spine and maxillofacial structures were also generated. COMPARISON:  Head CT 12/29/2013 FINDINGS: CT HEAD FINDINGS No intracranial hemorrhage, mass effect, or midline shift. Generalized atrophy which is progressed from 2015 exam. Chronic small vessel ischemia also progressed. No hydrocephalus. The basilar cisterns are patent. No evidence of territorial infarct. No intracranial fluid collection. Atherosclerosis of skullbase vasculature. No calvarial fracture. Calvarium is intact. The mastoid air cells are well aerated. CT MAXILLOFACIAL FINDINGS No facial bone fracture. The orbits and globes are intact. The nasal bone, mandibles, zygomatic arches and pterygoid plates are intact. Paranasal sinuses are well-aerated without fluid level. Periodontal disease with full missing teeth. Periapical lucency about right lower cuspid. No radiopaque foreign body or localizing soft tissue abnormality. CT CERVICAL SPINE FINDINGS No fracture or acute subluxation. The dens is intact. There are no jumped or perched facets. Vertebral body heights are preserved. There is multilevel degenerative change. Degenerative disc disease with disc space narrowing and endplate spurring Q6-V7 through C6-C7. Facet arthropathy throughout. Minimal anterolisthesis of C3 on C4 and C4 on C5 appears degenerative. No prevertebral soft tissue edema. IMPRESSION: 1. No acute intracranial abnormality or calvarial fracture. Progressive atrophy and chronic small vessel ischemia from 2015 exam. 2. No acute facial bone fracture.  Periodontal disease. 3. Degenerative change throughout cervical spine without acute fracture or subluxation. Electronically Signed    By: Rubye Oaks M.D.   On: 02/12/2016 02:11    My personal review of EKG: Rhythm NSR   Assessment & Plan:    Active Problems:   Altered mental status   HCAP (healthcare-associated pneumonia)   1. Altered mental status- likely combination of pneumonia as well as Ativan. Patient was started on IV antibiotics, will hold at events time. CT head is negative for acute abnormality. 2. Healthcare associated pneumonia- start vancomycin and is of temporal pharmacy consultation, [cultures, sputum cultures check urine strep pneumo antigen, Legionella antigen.    DVT Prophylaxis-   Lovenox   AM Labs Ordered, also please review Full Orders  Family Communication: No family present at bedside  Code Status:  DO NOT RESUSCITATE, as per records from skilled facility  Admission status: Observation    Time spent in minutes : 55 minutes   Dayonna Selbe S M.D on 02/12/2016 at 2:49 AM  Between 7am to 7pm - Pager - [phone]. After 7pm go to [email or web address] - password [facility]  [facility] - Office  [phone]

## 2016-02-12 NOTE — Progress Notes (Signed)
ANTIBIOTIC CONSULT NOTE-Preliminary  Pharmacy Consult for Vancomycin Indication: Pneumonia  Allergies  Allergen Reactions  . Sulfa Antibiotics Other (See Comments)    Lesions in her mouth  . Penicillins     swelling    Patient Measurements: Height: 5\' 5"  (165.1 cm) Weight: 163 lb 12.8 oz (74.3 kg) IBW/kg (Calculated) : 57  Vital Signs: Temp: 97.5 F (36.4 C) (08/25 0325) Temp Source: Oral (08/25 0325) BP: 155/71 (08/25 0325) Pulse Rate: 64 (08/25 0325)  Labs:  Recent Labs  02/11/16 2210  WBC 11.3*  HGB 11.4*  PLT 185  CREATININE 1.12*    Estimated Creatinine Clearance: 37.7 mL/min (by C-G formula based on SCr of 1.12 mg/dL).  No results for input(s): VANCOTROUGH, VANCOPEAK, VANCORANDOM, GENTTROUGH, GENTPEAK, GENTRANDOM, TOBRATROUGH, TOBRAPEAK, TOBRARND, AMIKACINPEAK, AMIKACINTROU, AMIKACIN in the last 72 hours.   Microbiology: No results found for this or any previous visit (from the past 720 hour(s)).  Medical History: Past Medical History:  Diagnosis Date  . Alzheimer's dementia   . Anxiety   . Depression   . Migraines     Medications:  Aztreonam 2 Gm IV every 8 hours  Assessment: 82 yo female nursing home resident with hx of dementia seen in the ED due to altered mental status and report of a fall. Chest xray showed opacity and possible pneumonia. Empiric antibiotics for HCAP.  Goal of Therapy:  Vancomycin troughs 15-20 mcg/ml  Plan:  Preliminary review of pertinent patient information completed.  Protocol will be initiated with a one-time dose of Vancomycin 1 Gm IV.Veronica Hood.  Veronica HawkingAnnie Hood clinical pharmacist will complete review during morning rounds to assess patient and finalize treatment regimen.  Veronica Hood, Veronica Hood, RPH 02/12/2016,3:41 AM.

## 2016-02-12 NOTE — Progress Notes (Signed)
[facility], out via w/c with staff and transporter [facility].

## 2016-02-12 NOTE — ED Provider Notes (Signed)
2:30 AM  Pt initially seen in ED by Dr. Estell HarpinZammit.  Here for altered mental status, fall 2 days ago. Workup in the emergency department unremarkable. Chest x-ray showed possible right-sided atelectasis versus opacity. Felt more likely to be atelectasis. She did have hypoxia but was not secondary to sleep apnea, sedation from possible Ativan, trazodone. [facility] CT imaging of her head, face and cervical spine which were negative. Sent back for admission. Patient does have hypoxia on nasal cannula but is breathing through her mouth. When she is woken up her oxygen saturation is 99% on room air. I feel this is likely some sleep apnea and somewhat due to sedation. She is arousable to touch but does not answer questions or follows commands. Discussed with Dr. Sharl MaLama with hospitalist service for admission. Will admit to telemetry, observation.   Layla MawKristen N Ronda Rajkumar, DO 02/12/16 [phone]

## 2016-02-12 NOTE — Discharge Summary (Signed)
Physician Discharge Summary  Veronica Hood ZDG:[id number] DOB: [birth year] DOA: 02/11/2016  PCP: Josue Hector, MD  Admit date: 02/11/2016 Discharge date: 02/12/2016  Time spent: 45 minutes  Recommendations for Outpatient Follow-up:  -Will be discharged back to SNF today.   Discharge Diagnoses:  Active Problems:   Altered mental status   HCAP (healthcare-associated pneumonia)   Discharge Condition: Stable and improved  Filed Weights   02/11/16 2033 02/12/16 0325  Weight: 69.9 kg (154 lb) 74.3 kg (163 lb 12.8 oz)    History of present illness:  As per Dr. Sharl Ma on 8/25: Veronica Hood  is a 82 y.o. female, Was sent to ED from skilled facility due to altered mental status. Patient has history of dementia and has been having behavior issues and his care facility where she was given lorazepam when necessary in addition to her regular dose. Patient apparently fell at the facility, and was sent to [facility]. CT head, cervical spine and maxillofacial without contrast which were all negative for acute abnormality. Chest x-ray showed linear and patchy opacity, may be atelectasis, pneumonia. Patient becomes hypoxic on sleeping, currently on oxygen via nonrebreather.  Hospital Course:   ?HCAP -Has a questionable infiltrate on CXR. In absence of fever, leukocytosis, cough, SOB, will elect to DC abx at this time. -I do not believe she has PNA.  Acute on Chronic Encephalopathy -Per son, has become more aggressive and combative over the past week and this coincides with the diagnosis of a UTI for which she has completed a course of cipro. -UA this admission is clear. -Behavioral disturbances are likely related to infectious source on top of her baseline dementia. -She has been calm today.  Rest of chronic conditions have been stable.  Procedures:  None   Consultations:  None  Discharge Instructions  Discharge Instructions    Increase activity slowly     Complete by:  As directed       Medication List    STOP taking these medications   ciprofloxacin 500 MG/5ML (10%) suspension Commonly known as:  CIPRO   ROBITUSSIN COUGH/COLD CF 5-10-100 MG/5ML Liqd Generic drug:  Phenylephrine-DM-GG     TAKE these medications   acetaminophen 500 MG tablet Commonly known as:  TYLENOL Take 1,000 mg by mouth 2 (two) times daily.   benzonatate 100 MG capsule Commonly known as:  TESSALON Take 100 mg by mouth 2 (two) times daily as needed for cough.   cholecalciferol 1000 units tablet Commonly known as:  VITAMIN D Take 1,000 Units by mouth daily.   Cranberry 450 MG Tabs Take 1 tablet by mouth daily.   docusate sodium 100 MG capsule Commonly known as:  COLACE Take 100 mg by mouth 2 (two) times daily.   escitalopram 20 MG tablet Commonly known as:  LEXAPRO Take 10 mg by mouth daily.   gabapentin 100 MG capsule Commonly known as:  NEURONTIN Take 100 mg by mouth 2 (two) times daily.   ibuprofen 600 MG tablet Commonly known as:  ADVIL,MOTRIN Take 600 mg by mouth daily as needed for headache.   LORazepam 0.5 MG tablet Commonly known as:  ATIVAN Take 0.5 mg by mouth 3 (three) times daily. *May take one-half tablet twice daily as needed for anxiety   Melatonin 3 MG Tabs Take 1 tablet by mouth at bedtime.   QUEtiapine 50 MG tablet Commonly known as:  SEROQUEL Take 50 mg by mouth at bedtime.   ranitidine 150 MG tablet Commonly known as:  ZANTAC Take 150 mg by mouth 2 (two) times daily.   traZODone 50 MG tablet Commonly known as:  DESYREL Take 25 mg by mouth at bedtime.      Allergies  Allergen Reactions  . Sulfa Antibiotics Other (See Comments)    Lesions in her mouth  . Penicillins     swelling      The results of significant diagnostics from this hospitalization (including imaging, microbiology, ancillary and laboratory) are listed below for reference.    Significant Diagnostic Studies: Ct Head Wo Contrast  Result  Date: 02/12/2016 CLINICAL DATA:  Fall with facial bruising in altered mental status. EXAM: CT HEAD WITHOUT CONTRAST CT MAXILLOFACIAL WITHOUT CONTRAST CT CERVICAL SPINE WITHOUT CONTRAST TECHNIQUE: Multidetector CT imaging of the head, cervical spine, and maxillofacial structures were performed using the standard protocol without intravenous contrast. Multiplanar CT image reconstructions of the cervical spine and maxillofacial structures were also generated. COMPARISON:  Head CT 12/29/2013 FINDINGS: CT HEAD FINDINGS No intracranial hemorrhage, mass effect, or midline shift. Generalized atrophy which is progressed from 2015 exam. Chronic small vessel ischemia also progressed. No hydrocephalus. The basilar cisterns are patent. No evidence of territorial infarct. No intracranial fluid collection. Atherosclerosis of skullbase vasculature. No calvarial fracture. Calvarium is intact. The mastoid air cells are well aerated. CT MAXILLOFACIAL FINDINGS No facial bone fracture. The orbits and globes are intact. The nasal bone, mandibles, zygomatic arches and pterygoid plates are intact. Paranasal sinuses are well-aerated without fluid level. Periodontal disease with full missing teeth. Periapical lucency about right lower cuspid. No radiopaque foreign body or localizing soft tissue abnormality. CT CERVICAL SPINE FINDINGS No fracture or acute subluxation. The dens is intact. There are no jumped or perched facets. Vertebral body heights are preserved. There is multilevel degenerative change. Degenerative disc disease with disc space narrowing and endplate spurring Z6-X0C4-C5 through C6-C7. Facet arthropathy throughout. Minimal anterolisthesis of C3 on C4 and C4 on C5 appears degenerative. No prevertebral soft tissue edema. IMPRESSION: 1. No acute intracranial abnormality or calvarial fracture. Progressive atrophy and chronic small vessel ischemia from 2015 exam. 2. No acute facial bone fracture.  Periodontal disease. 3. Degenerative  change throughout cervical spine without acute fracture or subluxation. Electronically Signed   By: Rubye OaksMelanie  Ehinger M.D.   On: 02/12/2016 02:11   Ct Cervical Spine Wo Contrast  Result Date: 02/12/2016 CLINICAL DATA:  Fall with facial bruising in altered mental status. EXAM: CT HEAD WITHOUT CONTRAST CT MAXILLOFACIAL WITHOUT CONTRAST CT CERVICAL SPINE WITHOUT CONTRAST TECHNIQUE: Multidetector CT imaging of the head, cervical spine, and maxillofacial structures were performed using the standard protocol without intravenous contrast. Multiplanar CT image reconstructions of the cervical spine and maxillofacial structures were also generated. COMPARISON:  Head CT 12/29/2013 FINDINGS: CT HEAD FINDINGS No intracranial hemorrhage, mass effect, or midline shift. Generalized atrophy which is progressed from 2015 exam. Chronic small vessel ischemia also progressed. No hydrocephalus. The basilar cisterns are patent. No evidence of territorial infarct. No intracranial fluid collection. Atherosclerosis of skullbase vasculature. No calvarial fracture. Calvarium is intact. The mastoid air cells are well aerated. CT MAXILLOFACIAL FINDINGS No facial bone fracture. The orbits and globes are intact. The nasal bone, mandibles, zygomatic arches and pterygoid plates are intact. Paranasal sinuses are well-aerated without fluid level. Periodontal disease with full missing teeth. Periapical lucency about right lower cuspid. No radiopaque foreign body or localizing soft tissue abnormality. CT CERVICAL SPINE FINDINGS No fracture or acute subluxation. The dens is intact. There are no jumped or perched facets. Vertebral body  heights are preserved. There is multilevel degenerative change. Degenerative disc disease with disc space narrowing and endplate spurring Z6-X0 through C6-C7. Facet arthropathy throughout. Minimal anterolisthesis of C3 on C4 and C4 on C5 appears degenerative. No prevertebral soft tissue edema. IMPRESSION: 1. No acute  intracranial abnormality or calvarial fracture. Progressive atrophy and chronic small vessel ischemia from 2015 exam. 2. No acute facial bone fracture.  Periodontal disease. 3. Degenerative change throughout cervical spine without acute fracture or subluxation. Electronically Signed   By: Rubye Oaks M.D.   On: 02/12/2016 02:11   Dg Chest Portable 1 View  Result Date: 02/11/2016 CLINICAL DATA:  Altered mental status. EXAM: PORTABLE CHEST 1 VIEW COMPARISON:  Radiograph 05/26/2014 FINDINGS: Lung volumes are low. Heart size and mediastinal contours are unchanged allowing for differences in technique. There is tortuosity and atherosclerosis of thoracic aorta. Linear and patchy right lung base opacity. Minimal left infrahilar atelectasis. No pleural effusion or pneumothorax. Calcified breast implants are noted. IMPRESSION: 1. Linear and patchy right basilar opacity, may be atelectasis, pneumonia, or aspiration. 2. Tortuosity and atherosclerosis of the thoracic aorta. Electronically Signed   By: Rubye Oaks M.D.   On: 02/11/2016 22:19   Ct Maxillofacial Wo Contrast  Result Date: 02/12/2016 CLINICAL DATA:  Fall with facial bruising in altered mental status. EXAM: CT HEAD WITHOUT CONTRAST CT MAXILLOFACIAL WITHOUT CONTRAST CT CERVICAL SPINE WITHOUT CONTRAST TECHNIQUE: Multidetector CT imaging of the head, cervical spine, and maxillofacial structures were performed using the standard protocol without intravenous contrast. Multiplanar CT image reconstructions of the cervical spine and maxillofacial structures were also generated. COMPARISON:  Head CT 12/29/2013 FINDINGS: CT HEAD FINDINGS No intracranial hemorrhage, mass effect, or midline shift. Generalized atrophy which is progressed from 2015 exam. Chronic small vessel ischemia also progressed. No hydrocephalus. The basilar cisterns are patent. No evidence of territorial infarct. No intracranial fluid collection. Atherosclerosis of skullbase vasculature. No  calvarial fracture. Calvarium is intact. The mastoid air cells are well aerated. CT MAXILLOFACIAL FINDINGS No facial bone fracture. The orbits and globes are intact. The nasal bone, mandibles, zygomatic arches and pterygoid plates are intact. Paranasal sinuses are well-aerated without fluid level. Periodontal disease with full missing teeth. Periapical lucency about right lower cuspid. No radiopaque foreign body or localizing soft tissue abnormality. CT CERVICAL SPINE FINDINGS No fracture or acute subluxation. The dens is intact. There are no jumped or perched facets. Vertebral body heights are preserved. There is multilevel degenerative change. Degenerative disc disease with disc space narrowing and endplate spurring R6-E4 through C6-C7. Facet arthropathy throughout. Minimal anterolisthesis of C3 on C4 and C4 on C5 appears degenerative. No prevertebral soft tissue edema. IMPRESSION: 1. No acute intracranial abnormality or calvarial fracture. Progressive atrophy and chronic small vessel ischemia from 2015 exam. 2. No acute facial bone fracture.  Periodontal disease. 3. Degenerative change throughout cervical spine without acute fracture or subluxation. Electronically Signed   By: Rubye Oaks M.D.   On: 02/12/2016 02:11    Microbiology: Recent Results (from the past 240 hour(s))  MRSA PCR Screening     Status: None   Collection Time: 02/12/16  3:40 AM  Result Value Ref Range Status   MRSA by PCR NEGATIVE NEGATIVE Final    Comment:        The GeneXpert MRSA Assay (FDA approved for NASAL specimens only), is one component of a comprehensive MRSA colonization surveillance program. It is not intended to diagnose MRSA infection nor to guide or monitor treatment for MRSA infections.   Culture,  blood (routine x 2) Call MD if unable to obtain prior to antibiotics being given     Status: None (Preliminary result)   Collection Time: 02/12/16  4:32 AM  Result Value Ref Range Status   Specimen  Description BLOOD LEFT ANTECUBITAL  Final   Special Requests   Final    BOTTLES DRAWN AEROBIC AND ANAEROBIC AEB 8CC ANA 4CC   Culture PENDING  Incomplete   Report Status PENDING  Incomplete  Culture, blood (routine x 2) Call MD if unable to obtain prior to antibiotics being given     Status: None (Preliminary result)   Collection Time: 02/12/16  4:44 AM  Result Value Ref Range Status   Specimen Description BLOOD LEFT WRIST  Final   Special Requests   Final    BOTTLES DRAWN AEROBIC AND ANAEROBIC AEB 6CC ANA 4CC   Culture PENDING  Incomplete   Report Status PENDING  Incomplete     Labs: Basic Metabolic Panel:  Recent Labs Lab 02/11/16 2210 02/12/16 0432  NA 138 141  K 3.6 3.4*  CL 109 112*  CO2 22 24  GLUCOSE 107* 93  BUN 22* 19  CREATININE 1.12* 0.93  CALCIUM 8.6* 8.5*   Liver Function Tests:  Recent Labs Lab 02/11/16 2210 02/12/16 0432  AST 20 18  ALT 14 13*  ALKPHOS 60 52  BILITOT 0.7 0.6  PROT 5.8* 5.1*  ALBUMIN 3.9 3.5   No results for input(s): LIPASE, AMYLASE in the last 168 hours. No results for input(s): AMMONIA in the last 168 hours. CBC:  Recent Labs Lab 02/11/16 2210 02/12/16 0432  WBC 11.3* 9.3  NEUTROABS 4.2  --   HGB 11.4* 10.9*  HCT 33.9* 32.3*  MCV 92.9 93.1  PLT 185 174   Cardiac Enzymes: No results for input(s): CKTOTAL, CKMB, CKMBINDEX, TROPONINI in the last 168 hours. BNP: BNP (last 3 results) No results for input(s): BNP in the last 8760 hours.  ProBNP (last 3 results) No results for input(s): PROBNP in the last 8760 hours.  CBG:  Recent Labs Lab 02/12/16 0225  GLUCAP 93       Signed:  HERNANDEZ ACOSTA,ESTELA  [facility] Pager: [phone] 02/12/2016, 11:24 AM

## 2016-02-12 NOTE — Progress Notes (Addendum)
Pharmacy Antibiotic Note  Veronica Hood is a 82 y.o. female admitted on 02/11/2016 with pneumonia.  Pharmacy has been consulted for Vancomycin dosing.  Plan: Vancomycin 1gm loading dose then 750mg  IV every 12 hours.  Goal trough 15-20 mcg/mL.  F/U cultures Monitor labs and V/S Deescalate therapy as indicated  Height: 5\' 5"  (165.1 cm) Weight: 163 lb 12.8 oz (74.3 kg) IBW/kg (Calculated) : 57  Temp (24hrs), Avg:97.2 F (36.2 C), Min:96.1 F (35.6 C), Max:97.8 F (36.6 C)   Recent Labs Lab 02/11/16 2210 02/12/16 0432  WBC 11.3* 9.3  CREATININE 1.12* 0.93    Estimated Creatinine Clearance: 45.4 mL/min (by C-G formula based on SCr of 0.93 mg/dL).    Allergies  Allergen Reactions  . Sulfa Antibiotics Other (See Comments)    Lesions in her mouth  . Penicillins     swelling    Antimicrobials this admission: Vancomycin 8/25 >>  Aztreonam 8/25>>   Dose adjustments this admission: n/a  Microbiology results: 8/25 BCx: pending  8/25 MRSA PCR: negative  Thank you for allowing pharmacy to be a part of this patient's care.  Veronica CyphersLorie Tykiera Hood, BS Pharm D, BCPS Clinical Pharmacist Pager [phone] 02/12/2016 8:02 AM

## 2016-02-12 NOTE — Clinical Social Work Note (Signed)
Clinical Social Work Assessment  Patient Details  Name: Veronica Hood MRN: [id number] Date of Birth: 22-Dec-1929  Date of referral:  02/12/16               Reason for consult:  Discharge Planning                Permission sought to share information with:  Oceanographeracility Contact Representative Permission granted to share information::  Yes, Verbal Permission Granted  Name::        Agency::  [facility]  Relationship::  facility  Contact Information:     Housing/Transportation Living arrangements for the past 2 months:  [facility] Source of Information:  Facility, Adult Children Patient Interpreter Needed:  None Criminal Activity/Legal Involvement Pertinent to Current Situation/Hospitalization:  No - Comment as needed Significant Relationships:  Adult Children Lives with:  Facility Resident Do you feel safe going back to the place where you live?  Yes Need for family participation in patient care:  Yes (Comment)  Care giving concerns:  Pt has been combative at ALF in past week.    Social Worker assessment / plan:  CSW called pt's son, Sherrine MaplesGlenn as pt is oriented to self only. [facility] unit for about a year. Family visits regularly. CSW spoke with Tammy at facility regarding pt. She states that for the past week, pt has been very combative and attacking staff. In observation for AMS, HCAP. Tammy indicates pt ambulates independently. Pt d/c today. Pt's son and facility aware and agreeable. Administrator agreeable to return with no FL2 due to <24 hour observation. Facility to provide transport.   Employment status:  Retired Database administratornsurance information:  Managed Medicare PT Recommendations:  Not assessed at this time Information / Referral to community resources:  Other (Comment Required) ([facility])  Patient/Family's Response to care:  Pt's [facility].    Patient/Family's Understanding of and Emotional Response to Diagnosis, Current Treatment, and Prognosis:  Pt's son is aware that pt is medically clear for d/c back to ALF.  Emotional Assessment Appearance:  Appears stated age Attitude/Demeanor/Rapport:  Unable to Assess Affect (typically observed):  Unable to Assess Orientation:  Oriented to Self Alcohol / Substance use:  Not Applicable Psych involvement (Current and /or in the community):  No (Comment)  Discharge Needs  Concerns to be addressed:  Discharge Planning Concerns Readmission within the last 30 days:  No Current discharge risk:  Cognitively Impaired Barriers to Discharge:  No Barriers Identified   Karn CassisStultz, Faustino Luecke [place], LCSW 02/12/2016, 12:52 PM [phone]

## 2016-02-13 LAB — HIV ANTIBODY (ROUTINE TESTING W REFLEX): HIV Screen 4th Generation wRfx: NONREACTIVE

## 2016-02-14 ENCOUNTER — Encounter (HOSPITAL_COMMUNITY): Payer: Self-pay | Admitting: Emergency Medicine

## 2016-02-14 ENCOUNTER — Emergency Department (HOSPITAL_COMMUNITY): Payer: Medicare Other

## 2016-02-14 ENCOUNTER — Emergency Department (HOSPITAL_COMMUNITY)
Admission: EM | Admit: 2016-02-14 | Discharge: 2016-02-14 | Disposition: A | Discharge status: Home / Self Care | Payer: Medicare Other | Attending: Emergency Medicine | Admitting: Emergency Medicine

## 2016-02-14 DIAGNOSIS — M542 Cervicalgia: Secondary | ICD-10-CM | POA: Diagnosis not present

## 2016-02-14 DIAGNOSIS — W0110XA Fall on same level from slipping, tripping and stumbling with subsequent striking against unspecified object, initial encounter: Secondary | ICD-10-CM | POA: Diagnosis not present

## 2016-02-14 DIAGNOSIS — G309 Alzheimer's disease, unspecified: Secondary | ICD-10-CM | POA: Insufficient documentation

## 2016-02-14 DIAGNOSIS — Y999 Unspecified external cause status: Secondary | ICD-10-CM | POA: Diagnosis not present

## 2016-02-14 DIAGNOSIS — Y9301 Activity, walking, marching and hiking: Secondary | ICD-10-CM | POA: Insufficient documentation

## 2016-02-14 DIAGNOSIS — S0990XA Unspecified injury of head, initial encounter: Secondary | ICD-10-CM | POA: Insufficient documentation

## 2016-02-14 DIAGNOSIS — Z791 Long term (current) use of non-steroidal anti-inflammatories (NSAID): Secondary | ICD-10-CM | POA: Insufficient documentation

## 2016-02-14 DIAGNOSIS — Z79899 Other long term (current) drug therapy: Secondary | ICD-10-CM | POA: Insufficient documentation

## 2016-02-14 DIAGNOSIS — Y929 Unspecified place or not applicable: Secondary | ICD-10-CM | POA: Diagnosis not present

## 2016-02-14 DIAGNOSIS — W19XXXA Unspecified fall, initial encounter: Secondary | ICD-10-CM

## 2016-02-14 NOTE — Discharge Instructions (Signed)
Her CT scans do not show new injury.  Return for worsening symptoms, including confusion, fever, inability to walk or any other symptoms concerning to you.

## 2016-02-14 NOTE — ED Notes (Signed)
[facility] to [facility] pt. nad noted. Pt ambulated out of department with this RN, NT, x2 security guards.

## 2016-02-14 NOTE — ED Notes (Signed)
Unable to obtain CT cervical spine due to pt movement. CT head completed, EDP aware.

## 2016-02-14 NOTE — ED Notes (Addendum)
Pt walking around nursing station and attempting to enter other patient rooms. Pt becoming increasingly agitated. Pt now back in room with pt safety sitter and pt son.   Pt continues to refuse vital signs.

## 2016-02-14 NOTE — ED Notes (Signed)
EMS called to transport [facility].

## 2016-02-14 NOTE — ED Notes (Signed)
Per Crystal at [facility], pt has had several falls due to walking fast and tripping over feet. Pt has been progressively becoming more aggressive and anxious over last several days. Per Crystal, pt fell this am and hit head but denies loc. EDP aware.  At time of arrival, pt alert and able to move all extremities but not to command. Yellow fall risk socks placed on patient, Pt safety sitter at bedside as well. nad noted.

## 2016-02-14 NOTE — ED Provider Notes (Signed)
[facility]-EM[facility]Y DEPT Provider Note   CSN: [id number] Arrival date & time: 02/14/16  1022  By signing my name below, I, Rosario Adie, attest that this documentation has been prepared under the direction and in the presence of Lavera Guise, MD. Electronically Signed: Rosario Adie, ED Scribe. 02/14/16. 10:44 AM.  History   Chief Complaint Chief Complaint  Patient presents with  . Fall   LEVEL 5 CAVEAT: HPI and ROS limited due to Alzheimer's disease   The history is provided by the patient. History limited by: Alzheimer's. No language interpreter was used.   HPI Comments: Jaynie Hitch is a 82 y.o. female BIB EMS from [place] [facility], with a PMHx significant of Alzheimer's, who presents to the Emergency Department for evaluation s/p unwitnessed fall that occurred PTA. Facility reports that the patient was eating at a table when they came back and found her on the ground. She has had frequent falls because of trying to rush herself while walking, and falls while walking too fast. It appears according to the nursing facility that she was trying to get up from her chair too quickly to walk, and tripped. EMS reports that she did strike her head upon falling, but deny LOC. Pt does not remember her fall. She reports associated posterior neck pain while in the ED. Per nursing home, pt is confused at baseline due to her hx of Alzheimer's. She denies abdominal pain, chest wall pain, back pain, or pain otherwise at bedside.   Past Medical History:  Diagnosis Date  . Alzheimer's dementia   . Anxiety   . Depression   . Migraines    Patient Active Problem List   Diagnosis Date Noted  . Altered mental status 02/12/2016  . HCAP (healthcare-associated pneumonia) 02/12/2016   History reviewed. No pertinent surgical history.  OB History    No data available     Home Medications    Prior to Admission medications   Medication Sig Start Date End Date Taking? Authorizing Provider    acetaminophen (TYLENOL) 500 MG tablet Take 1,000 mg by mouth 2 (two) times daily.   Yes Historical Provider, MD  benzonatate (TESSALON) 100 MG capsule Take 100 mg by mouth 2 (two) times daily as needed for cough.   Yes Historical Provider, MD  cholecalciferol (VITAMIN D) 1000 units tablet Take 1,000 Units by mouth daily.   Yes Historical Provider, MD  Cranberry 450 MG TABS Take 1 tablet by mouth daily.   Yes Historical Provider, MD  docusate sodium (COLACE) 100 MG capsule Take 100 mg by mouth 2 (two) times daily.   Yes Historical Provider, MD  escitalopram (LEXAPRO) 20 MG tablet Take 10 mg by mouth daily.   Yes Historical Provider, MD  gabapentin (NEURONTIN) 100 MG capsule Take 100 mg by mouth 2 (two) times daily.   Yes Historical Provider, MD  ibuprofen (ADVIL,MOTRIN) 600 MG tablet Take 600 mg by mouth daily as needed for headache.   Yes Historical Provider, MD  LORazepam (ATIVAN) 0.5 MG tablet Take 0.5 mg by mouth 3 (three) times daily. *May take one-half tablet twice daily as needed for anxiety   Yes Historical Provider, MD  Melatonin 3 MG TABS Take 1 tablet by mouth at bedtime.   Yes Historical Provider, MD  QUEtiapine (SEROQUEL) 50 MG tablet Take 50 mg by mouth at bedtime.   Yes Historical Provider, MD  ranitidine (ZANTAC) 150 MG tablet Take 150 mg by mouth 2 (two) times daily.   Yes Historical Provider, MD  traZODone (DESYREL) 50 MG tablet Take 25 mg by mouth at bedtime.   Yes Historical Provider, MD   Family History Family History  Problem Relation Age of Onset  . Alzheimer's disease Father    Social History Social History  Substance Use Topics  . Smoking status: Never Smoker  . Smokeless tobacco: Never Used  . Alcohol use No   Allergies   Sulfa antibiotics and Penicillins  Review of Systems Review of Systems  Unable to perform ROS: Mental status change  Cardiovascular: Negative for chest pain (wall).  Gastrointestinal: Negative for abdominal pain.  Musculoskeletal:  Positive for neck pain. Negative for back pain.   Physical Exam Updated Vital Signs BP 140/85 (BP Location: Left Arm)   Pulse 96   Temp 98 F (36.7 C) (Oral)   Resp 18   Ht 5\' 5"  (1.651 m)   Wt 163 lb (73.9 kg)   SpO2 97%   BMI 27.12 kg/m   Physical Exam Physical Exam  Nursing note and vitals reviewed. Constitutional: Well developed, well nourished, non-toxic, and in no acute distress Head: Normocephalic. Old left periorbital ecchymosis.  Mouth/Throat: Oropharynx is clear and moist.  Neck: Normal range of motion. Neck supple. Minimal neck tenderness.  Cardiovascular: Normal rate and regular rhythm.   Pulmonary/Chest: Effort normal and breath sounds normal.  Abdominal: Soft. There is no tenderness. There is no rebound and no guarding.  Musculoskeletal: Normal range of motion. No deformities.  Neurological: Alert, no facial droop, moves all extremities symmetrically Skin: Skin is warm and dry.  Psychiatric: Cooperative  ED Treatments / Results  DIAGNOSTIC STUDIES: Oxygen Saturation is 97% on RA, normal by my interpretation.   COORDINATION OF CARE: 10:43 AM-Discussed next steps with pt.   Labs (all labs ordered are listed, but only abnormal results are displayed) Labs Reviewed - No data to display  EKG  EKG Interpretation None      Radiology Ct Head Wo Contrast  Result Date: 02/14/2016 CLINICAL DATA:  Status post unwitnessed fall with head trauma and posterior neck pain patient has an Alzheimer's dementia. EXAM: CT HEAD WITHOUT CONTRAST CT CERVICAL SPINE WITHOUT CONTRAST TECHNIQUE: Multidetector CT imaging of the head and cervical spine was performed following the standard protocol without intravenous contrast. Multiplanar CT image reconstructions of the cervical spine were also generated. COMPARISON:  02/12/2016 FINDINGS: CT HEAD FINDINGS Brain: No evidence of acute infarction, hemorrhage, hydrocephalus, extra-axial collection or mass lesion/mass effect. There is  advanced brain parenchymal volume loss and periventricular microangiopathy. Vascular: Calcific atherosclerotic disease of the intracranial vessels at the skullbase. Skull: No displaced fractures. CT CERVICAL SPINE FINDINGS The study is degraded by motion artifact despite repeated attempts of scanning. No definite evidence of displaced fractures. There is an oblique curvilinear lucency through C6 vertebral body, which may represent an artifact due to motion or potentially a nondisplaced fracture. There is minimal anterolisthesis at multiple levels, mainly C3 on C4, C4 on C5, C5 on C6, C6 on C7, likely degenerative. Moderate multilevel osteoarthritic changes of the spine are seen with significant posterior facet arthropathy. IMPRESSION: No acute intracranial abnormality. Atrophy, chronic microvascular disease. Evaluation of the cervical spine severely degraded by motion artifact, despite repeated skin and attempts. Lucency through C6 vertebral body may represent an artifact due to motion or potentially a nondisplaced fracture. Please correlate to point of tenderness. Multilevel osteoarthritic changes of the cervical spine with minimal anterolisthesis at multiple levels. Electronically Signed   By: Ted Mcalpine M.D.   On: 02/14/2016 12:11  Ct Cervical Spine Wo Contrast  Result Date: 02/14/2016 CLINICAL DATA:  Status post unwitnessed fall with head trauma and posterior neck pain patient has an Alzheimer's dementia. EXAM: CT HEAD WITHOUT CONTRAST CT CERVICAL SPINE WITHOUT CONTRAST TECHNIQUE: Multidetector CT imaging of the head and cervical spine was performed following the standard protocol without intravenous contrast. Multiplanar CT image reconstructions of the cervical spine were also generated. COMPARISON:  02/12/2016 FINDINGS: CT HEAD FINDINGS Brain: No evidence of acute infarction, hemorrhage, hydrocephalus, extra-axial collection or mass lesion/mass effect. There is advanced brain parenchymal volume  loss and periventricular microangiopathy. Vascular: Calcific atherosclerotic disease of the intracranial vessels at the skullbase. Skull: No displaced fractures. CT CERVICAL SPINE FINDINGS The study is degraded by motion artifact despite repeated attempts of scanning. No definite evidence of displaced fractures. There is an oblique curvilinear lucency through C6 vertebral body, which may represent an artifact due to motion or potentially a nondisplaced fracture. There is minimal anterolisthesis at multiple levels, mainly C3 on C4, C4 on C5, C5 on C6, C6 on C7, likely degenerative. Moderate multilevel osteoarthritic changes of the spine are seen with significant posterior facet arthropathy. IMPRESSION: No acute intracranial abnormality. Atrophy, chronic microvascular disease. Evaluation of the cervical spine severely degraded by motion artifact, despite repeated skin and attempts. Lucency through C6 vertebral body may represent an artifact due to motion or potentially a nondisplaced fracture. Please correlate to point of tenderness. Multilevel osteoarthritic changes of the cervical spine with minimal anterolisthesis at multiple levels. Electronically Signed   By: Ted Mcalpineobrinka  Dimitrova M.D.   On: 02/14/2016 12:11    Procedures Procedures (including critical care time)  Medications Ordered in ED Medications - No data to display  Initial Impression / Assessment and Plan / ED Course  I have reviewed the triage vital signs and the nursing notes.  Pertinent labs & imaging results that were available during my care of the patient were reviewed by me and considered in my medical decision making (see chart for details).  Clinical Course    82 year old female with dementia who presents from nursing home after mechanical fall. No blood thinners. She has old bruising from her prior fall several days ago. She is at her mental status baseline according to nursing facility. No other injuries noted on exam. CT head  and cervical spine are performed. No obvious acute traumatic injuries of the head and neck. Motion artifact around C6, that could be questionable fracture, although she is not having tenderness at this area and I do not suspect fracture. She is grossly neurologically intact. We'll discharge her back to the facility and she is cleared from traumatic standpoint and behaving at baseline.  Final Clinical Impressions(s) / ED Diagnoses   Final diagnoses:  Fall, initial encounter  Head injury, initial encounter    New Prescriptions New Prescriptions   No medications on file    I personally performed the services described in this documentation, which was scribed in my presence. The recorded information has been reviewed and is accurate.     Lavera Guiseana Duo Brode Sculley, MD 02/14/16 1224

## 2016-02-14 NOTE — ED Triage Notes (Signed)
[facility].  Old injuries noted.  Pt confused at baseline.

## 2016-02-14 NOTE — ED Notes (Addendum)
Report given to Crystal at [facility]. [facility] EMS cancelled.  Glen Manual, pt's son, also called and inquired about pt status. Glen aware that pt is being discharged back [facility] and is [facility].

## 2016-02-17 LAB — CULTURE, BLOOD (ROUTINE X 2)
CULTURE: NO GROWTH
CULTURE: NO GROWTH

## 2016-02-18 ENCOUNTER — Emergency Department (HOSPITAL_COMMUNITY): Payer: Medicare Other

## 2016-02-18 ENCOUNTER — Emergency Department (HOSPITAL_COMMUNITY)
Admission: EM | Admit: 2016-02-18 | Discharge: 2016-02-18 | Disposition: A | Discharge status: Home / Self Care | Payer: Medicare Other | Attending: Emergency Medicine | Admitting: Emergency Medicine

## 2016-02-18 ENCOUNTER — Encounter (HOSPITAL_COMMUNITY): Payer: Self-pay

## 2016-02-18 DIAGNOSIS — Z043 Encounter for examination and observation following other accident: Secondary | ICD-10-CM | POA: Insufficient documentation

## 2016-02-18 DIAGNOSIS — Y939 Activity, unspecified: Secondary | ICD-10-CM | POA: Diagnosis not present

## 2016-02-18 DIAGNOSIS — Z791 Long term (current) use of non-steroidal anti-inflammatories (NSAID): Secondary | ICD-10-CM | POA: Insufficient documentation

## 2016-02-18 DIAGNOSIS — Y92129 Unspecified place in nursing home as the place of occurrence of the external cause: Secondary | ICD-10-CM | POA: Diagnosis not present

## 2016-02-18 DIAGNOSIS — W19XXXA Unspecified fall, initial encounter: Secondary | ICD-10-CM | POA: Insufficient documentation

## 2016-02-18 DIAGNOSIS — Z79899 Other long term (current) drug therapy: Secondary | ICD-10-CM | POA: Insufficient documentation

## 2016-02-18 DIAGNOSIS — Y999 Unspecified external cause status: Secondary | ICD-10-CM | POA: Diagnosis not present

## 2016-02-18 DIAGNOSIS — F0390 Unspecified dementia without behavioral disturbance: Secondary | ICD-10-CM | POA: Diagnosis not present

## 2016-02-18 HISTORY — DX: Repeated falls: R29.6

## 2016-02-18 MED ORDER — LORAZEPAM 0.5 MG PO TABS
0.5000 mg | ORAL_TABLET | Freq: Once | ORAL | Status: AC
  Administered 2016-02-18: 0.5 mg via ORAL
  Filled 2016-02-18: qty 1

## 2016-02-18 NOTE — ED Triage Notes (Signed)
[facility]. No injury noted.

## 2016-02-18 NOTE — Discharge Instructions (Signed)
Take your usual prescriptions as previously directed.  Call your regular medical doctor tomorrow to schedule a follow up appointment within the next 2 days.  Return to the Emergency Department immediately sooner if worsening.  ° °

## 2016-02-18 NOTE — ED Notes (Signed)
Pt to CT

## 2016-02-18 NOTE — ED Provider Notes (Signed)
[facility] Provider Note   CSN: [id number] Arrival date & time: 02/18/16  1504     History   Chief Complaint Chief Complaint  Patient presents with  . Fall    HPI Veronica Hood is a 82 y.o. female.  The history is provided by the patient, the EMS personnel and the nursing home. The history is limited by the condition of the patient (Hx dementia).  Fall   Pt was seen at 1520.  Per EMS and NH report: Pt s/p fall at facility PTA. Pt has hx of dementia and frequent falls. The symptoms have been associated with no other complaints. The patient has a significant history of similar symptoms previously, recently being evaluated for this complaint and multiple prior evals for same.     Past Medical History:  Diagnosis Date  . Alzheimer's dementia   . Anxiety   . Depression   . Frequent falls   . Migraines     Patient Active Problem List   Diagnosis Date Noted  . Altered mental status 02/12/2016  . HCAP (healthcare-associated pneumonia) 02/12/2016    History reviewed. No pertinent surgical history.    Home Medications    Prior to Admission medications   Medication Sig Start Date End Date Taking? Authorizing Provider  acetaminophen (TYLENOL) 500 MG tablet Take 1,000 mg by mouth 2 (two) times daily.   Yes Historical Provider, MD  benzonatate (TESSALON) 100 MG capsule Take 100 mg by mouth 2 (two) times daily as needed for cough.   Yes Historical Provider, MD  cholecalciferol (VITAMIN D) 1000 units tablet Take 1,000 Units by mouth daily.   Yes Historical Provider, MD  Cranberry 450 MG TABS Take 1 tablet by mouth daily.   Yes Historical Provider, MD  docusate sodium (COLACE) 100 MG capsule Take 100 mg by mouth 2 (two) times daily.   Yes Historical Provider, MD  escitalopram (LEXAPRO) 20 MG tablet Take 10 mg by mouth daily.   Yes Historical Provider, MD  gabapentin (NEURONTIN) 100 MG capsule Take 100 mg by mouth 2 (two) times daily.   Yes Historical Provider, MD    ibuprofen (ADVIL,MOTRIN) 600 MG tablet Take 600 mg by mouth daily as needed for headache.   Yes Historical Provider, MD  LORazepam (ATIVAN) 0.5 MG tablet Take 0.5 mg by mouth 3 (three) times daily. *May take one-half tablet twice daily as needed for anxiety   Yes Historical Provider, MD  Melatonin 3 MG TABS Take 1 tablet by mouth at bedtime.   Yes Historical Provider, MD  QUEtiapine (SEROQUEL) 50 MG tablet Take 50 mg by mouth at bedtime.   Yes Historical Provider, MD  ranitidine (ZANTAC) 150 MG tablet Take 150 mg by mouth 2 (two) times daily.   Yes Historical Provider, MD  traZODone (DESYREL) 50 MG tablet Take 25 mg by mouth at bedtime.   Yes Historical Provider, MD    Family History Family History  Problem Relation Age of Onset  . Alzheimer's disease Father     Social History Social History  Substance Use Topics  . Smoking status: Never Smoker  . Smokeless tobacco: Never Used  . Alcohol use No     Allergies   Sulfa antibiotics and Penicillins   Review of Systems Review of Systems  Unable to perform ROS: Dementia     Physical Exam Updated Vital Signs BP 152/64 (BP Location: Left Arm)   Pulse 91   Resp 20   SpO2 95%   Physical Exam 1525: Physical examination:  Nursing notes reviewed; Vital signs and O2 SAT reviewed;  Constitutional: Well developed, Well nourished, Well hydrated, In no acute distress; Head:  Normocephalic, +fading ecchymosis left periorbital area.; Eyes: EOMI, PERRL, No scleral icterus; ENMT: Mouth and pharynx normal, Mucous membranes moist; Neck: Supple, Full range of motion, No lymphadenopathy; Cardiovascular: Regular rate and rhythm, No gallop; Respiratory: Breath sounds clear & equal bilaterally, No wheezes.  Speaking full sentences with ease, Normal respiratory effort/excursion; Chest: Nontender, Movement normal; Abdomen: Soft, Nontender, Nondistended, Normal bowel sounds; Genitourinary: No CVA tenderness;  Spine:  No midline CS, TS, LS tenderness.;;   Extremities: Pulses normal, No tenderness, No edema, No calf edema or asymmetry.; Neuro: Awake, alert, confused per hx dementia. No facial droop. Speech clear. No gross focal motor deficits in extremities. Climbs on and off stretcher easily with assist and ambulates around ED exam room and hallways, NAD, talking with ED staff..; Skin: Color normal, Warm, Dry.   ED Treatments / Results  Labs (all labs ordered are listed, but only abnormal results are displayed)   EKG  EKG Interpretation None       Radiology   Procedures Procedures (including critical care time)  Medications Ordered in ED Medications - No data to display   Initial Impression / Assessment and Plan / ED Course  I have reviewed the triage vital signs and the nursing notes.  Pertinent labs & imaging results that were available during my care of the patient were reviewed by me and considered in my medical decision making (see chart for details).  MDM Reviewed: previous chart, nursing note and vitals Reviewed previous: CT scan Interpretation: CT scan     Ct Head Wo Contrast Result Date: 02/18/2016 CLINICAL DATA:  82 year old female with history of trauma from a fall. EXAM: CT HEAD WITHOUT CONTRAST CT CERVICAL SPINE WITHOUT CONTRAST TECHNIQUE: Multidetector CT imaging of the head and cervical spine was performed following the standard protocol without intravenous contrast. Multiplanar CT image reconstructions of the cervical spine were also generated. COMPARISON:  Head CT and cervical spine CT 02/14/2016. FINDINGS: CT HEAD FINDINGS Mild cerebral atrophy. Patchy and confluent areas of decreased attenuation are noted throughout the deep and periventricular white matter of the cerebral hemispheres bilaterally, compatible with chronic microvascular ischemic disease. No acute displaced skull fractures are identified. No acute intracranial abnormality. Specifically, no evidence of acute post-traumatic intracranial hemorrhage,  no definite regions of acute/subacute cerebral ischemia, no focal mass, mass effect, hydrocephalus or abnormal intra or extra-axial fluid collections. The visualized paranasal sinuses and mastoids are well pneumatized. CT CERVICAL SPINE FINDINGS Study is limited by considerable patient motion, despite repeat examination. With these limitations in mind, the previously described oblique lucency through the left side of the C6 vertebral body is again noted, and apparent on multiple adjacent images (sagittal series 15 image [id number]). Notably, this lucency was not evident on more remote prior cervical spine CT 02/12/2016, which was not compromised by motion. Unfortunately, secondary to motion, a fracture cannot be confirmed on axial or orthogonal images. No other potential displaced cervical spine fractures are noted. There is some reversal of normal cervical lordosis centered at C5, presumably positional. 3 mm of anterolisthesis of C3 upon C4. Multilevel degenerative disc disease, most severe at C6-C7. Severe multilevel facet arthropathy. Visualized portions of the upper thorax demonstrate emphysematous changes and atherosclerosis in the thoracic aorta. IMPRESSION: 1. Evaluation of the cervical spine is again limited by motion, but there is a persistent oblique lucency through the left lateral aspect of C6 vertebral  body concerning for (but not definitive for) a nondisplaced fracture. This is very similar to the prior examination from 02/14/2016. 2. No new fracture identified. 3. Multilevel degenerative disc disease and cervical spondylosis, as above, similar to prior examinations. 4. No evidence of significant acute traumatic injury to the skull or brain. 5. Cerebral atrophy with severe chronic microvascular ischemic changes in the cerebral white matter, as above. These results were called by telephone at the time of interpretation on 02/18/2016 at 4:50 pm to Dr. Samuel Jester, who verbally acknowledged these results.  Electronically Signed   By: Trudie Reed M.D.   On: 02/18/2016 16:54    Ct Cervical Spine Wo Contrast Result Date: 02/18/2016 CLINICAL DATA:  82 year old female with history of trauma from a fall. EXAM: CT HEAD WITHOUT CONTRAST CT CERVICAL SPINE WITHOUT CONTRAST TECHNIQUE: Multidetector CT imaging of the head and cervical spine was performed following the standard protocol without intravenous contrast. Multiplanar CT image reconstructions of the cervical spine were also generated. COMPARISON:  Head CT and cervical spine CT 02/14/2016. FINDINGS: CT HEAD FINDINGS Mild cerebral atrophy. Patchy and confluent areas of decreased attenuation are noted throughout the deep and periventricular white matter of the cerebral hemispheres bilaterally, compatible with chronic microvascular ischemic disease. No acute displaced skull fractures are identified. No acute intracranial abnormality. Specifically, no evidence of acute post-traumatic intracranial hemorrhage, no definite regions of acute/subacute cerebral ischemia, no focal mass, mass effect, hydrocephalus or abnormal intra or extra-axial fluid collections. The visualized paranasal sinuses and mastoids are well pneumatized. CT CERVICAL SPINE FINDINGS Study is limited by considerable patient motion, despite repeat examination. With these limitations in mind, the previously described oblique lucency through the left side of the C6 vertebral body is again noted, and apparent on multiple adjacent images (sagittal series 15 image 24-27). Notably, this lucency was not evident on more remote prior cervical spine CT 02/12/2016, which was not compromised by motion. Unfortunately, secondary to motion, a fracture cannot be confirmed on axial or orthogonal images. No other potential displaced cervical spine fractures are noted. There is some reversal of normal cervical lordosis centered at C5, presumably positional. 3 mm of anterolisthesis of C3 upon C4. Multilevel degenerative  disc disease, most severe at C6-C7. Severe multilevel facet arthropathy. Visualized portions of the upper thorax demonstrate emphysematous changes and atherosclerosis in the thoracic aorta. IMPRESSION: 1. Evaluation of the cervical spine is again limited by motion, but there is a persistent oblique lucency through the left lateral aspect of C6 vertebral body concerning for (but not definitive for) a nondisplaced fracture. This is very similar to the prior examination from 02/14/2016. 2. No new fracture identified. 3. Multilevel degenerative disc disease and cervical spondylosis, as above, similar to prior examinations. 4. No evidence of significant acute traumatic injury to the skull or brain. 5. Cerebral atrophy with severe chronic microvascular ischemic changes in the cerebral white matter, as above. These results were called by telephone at the time of interpretation on 02/18/2016 at 4:50 pm to Dr. Samuel Jester, who verbally acknowledged these results. Electronically Signed   By: Trudie Reed M.D.   On: 02/18/2016 16:54    Ct Cervical Spine Wo Contrast Result Date: 02/18/2016 CLINICAL DATA:  82 year old female with neck pain following fall. Equivocal C6 fracture on prior CTs but motion noted on those examinations. EXAM: CT CERVICAL SPINE WITHOUT CONTRAST TECHNIQUE: Multidetector CT imaging of the cervical spine was performed without intravenous contrast. Multiplanar CT image reconstructions were also generated. COMPARISON:  02/18/2016, 02/14/2016 and prior  CTs FINDINGS: There is no evidence of acute fracture or prevertebral soft tissue swelling. No fracture at C6 identified. 1-2 mm spondylolisthesis at C3-4, C4-5 and C5-6 is chronic. Mild multilevel facet arthropathy and degenerative disc disease noted. No suspicious focal bony lesions are identified. The soft tissue structures are unremarkable. IMPRESSION: No static evidence of acute injury to the cervical spine. No evidence of C6 fracture. Mild  multilevel facet arthropathy and degenerative disc disease. Electronically Signed   By: Harmon PierJeffrey  Hu M.D.   On: 02/18/2016 19:07    1915:  Initial CT CS with motion. Pt became increasingly agitated while in the ED; PO ativan given with good effect. Pt continues awake/alert, calmer, continues talkative with ED staff, no longer trying to climb off stretcher, but is able to walk with assist from stretcher to wheelchair for repeat CT CS. Repeat CT CS without C6 fx. Will d/c back to NH.     Final Clinical Impressions(s) / ED Diagnoses   Final diagnoses:  None    New Prescriptions New Prescriptions   No medications on file     Samuel JesterKathleen Tyrea Froberg, DO 02/20/16 2008

## 2016-02-19 ENCOUNTER — Encounter (HOSPITAL_COMMUNITY): Payer: Self-pay

## 2016-02-19 ENCOUNTER — Emergency Department (HOSPITAL_COMMUNITY): Payer: Medicare Other

## 2016-02-19 ENCOUNTER — Emergency Department (HOSPITAL_COMMUNITY)
Admission: EM | Admit: 2016-02-19 | Discharge: 2016-02-20 | Disposition: A | Discharge status: Home / Self Care | Payer: Medicare Other | Attending: Emergency Medicine | Admitting: Emergency Medicine

## 2016-02-19 DIAGNOSIS — W1800XA Striking against unspecified object with subsequent fall, initial encounter: Secondary | ICD-10-CM | POA: Diagnosis not present

## 2016-02-19 DIAGNOSIS — Z79899 Other long term (current) drug therapy: Secondary | ICD-10-CM | POA: Insufficient documentation

## 2016-02-19 DIAGNOSIS — Z791 Long term (current) use of non-steroidal anti-inflammatories (NSAID): Secondary | ICD-10-CM | POA: Insufficient documentation

## 2016-02-19 DIAGNOSIS — S0990XA Unspecified injury of head, initial encounter: Secondary | ICD-10-CM | POA: Diagnosis not present

## 2016-02-19 DIAGNOSIS — Y92129 Unspecified place in nursing home as the place of occurrence of the external cause: Secondary | ICD-10-CM | POA: Insufficient documentation

## 2016-02-19 DIAGNOSIS — N390 Urinary tract infection, site not specified: Secondary | ICD-10-CM | POA: Diagnosis not present

## 2016-02-19 DIAGNOSIS — Y939 Activity, unspecified: Secondary | ICD-10-CM | POA: Insufficient documentation

## 2016-02-19 DIAGNOSIS — Y999 Unspecified external cause status: Secondary | ICD-10-CM | POA: Insufficient documentation

## 2016-02-19 DIAGNOSIS — W19XXXA Unspecified fall, initial encounter: Secondary | ICD-10-CM

## 2016-02-19 LAB — URINALYSIS, ROUTINE W REFLEX MICROSCOPIC
Glucose, UA: NEGATIVE mg/dL
HGB URINE DIPSTICK: NEGATIVE
Ketones, ur: 15 mg/dL — AB
Leukocytes, UA: NEGATIVE
Nitrite: NEGATIVE
PH: 6 (ref 5.0–8.0)
Protein, ur: 30 mg/dL — AB

## 2016-02-19 LAB — CBC WITH DIFFERENTIAL/PLATELET
Basophils Absolute: 0 10*3/uL (ref 0.0–0.1)
Basophils Relative: 0 %
Eosinophils Absolute: 0.1 10*3/uL (ref 0.0–0.7)
Eosinophils Relative: 1 %
HEMATOCRIT: 36.1 % (ref 36.0–46.0)
HEMOGLOBIN: 12.1 g/dL (ref 12.0–15.0)
LYMPHS ABS: 6.5 10*3/uL — AB (ref 0.7–4.0)
LYMPHS PCT: 54 %
MCH: 31.2 pg (ref 26.0–34.0)
MCHC: 33.5 g/dL (ref 30.0–36.0)
MCV: 93 fL (ref 78.0–100.0)
MONOS PCT: 5 %
Monocytes Absolute: 0.6 10*3/uL (ref 0.1–1.0)
NEUTROS ABS: 4.9 10*3/uL (ref 1.7–7.7)
NEUTROS PCT: 40 %
Platelets: 210 10*3/uL (ref 150–400)
RBC: 3.88 MIL/uL (ref 3.87–5.11)
RDW: 14.8 % (ref 11.5–15.5)
WBC: 12.1 10*3/uL — AB (ref 4.0–10.5)

## 2016-02-19 LAB — URINE MICROSCOPIC-ADD ON

## 2016-02-19 LAB — BASIC METABOLIC PANEL
Anion gap: 5 (ref 5–15)
BUN: 26 mg/dL — AB (ref 6–20)
CHLORIDE: 110 mmol/L (ref 101–111)
CO2: 26 mmol/L (ref 22–32)
Calcium: 8.8 mg/dL — ABNORMAL LOW (ref 8.9–10.3)
Creatinine, Ser: 0.99 mg/dL (ref 0.44–1.00)
GFR calc Af Amer: 59 mL/min — ABNORMAL LOW (ref >60)
GFR calc non Af Amer: 51 mL/min — ABNORMAL LOW (ref >60)
GLUCOSE: 112 mg/dL — AB (ref 65–99)
POTASSIUM: 3.5 mmol/L (ref 3.5–5.1)
SODIUM: 141 mmol/L (ref 135–145)

## 2016-02-19 MED ORDER — CEFUROXIME AXETIL 250 MG PO TABS: 250.0000 mg | ORAL_TABLET | Freq: Two times a day (BID) | ORAL | 0 refills | Status: AC

## 2016-02-19 MED ORDER — TRAZODONE HCL 50 MG PO TABS
25.0000 mg | ORAL_TABLET | Freq: Every day | ORAL | Status: DC
  Administered 2016-02-19: 25 mg via ORAL
  Filled 2016-02-19: qty 1

## 2016-02-19 MED ORDER — LORAZEPAM 0.5 MG PO TABS
0.5000 mg | ORAL_TABLET | Freq: Once | ORAL | Status: AC
  Administered 2016-02-19: 0.5 mg via ORAL
  Filled 2016-02-19: qty 1

## 2016-02-19 MED ORDER — ESCITALOPRAM OXALATE 20 MG PO TABS: 20.0000 mg | ORAL_TABLET | Freq: Every day | ORAL | Status: DC

## 2016-02-19 MED ORDER — MELATONIN 3 MG PO TABS: 1.0000 | ORAL_TABLET | Freq: Every day | ORAL | Status: DC

## 2016-02-19 MED ORDER — LORAZEPAM 0.5 MG PO TABS: 0.5000 mg | ORAL_TABLET | Freq: Three times a day (TID) | ORAL | Status: DC

## 2016-02-19 MED ORDER — QUETIAPINE FUMARATE 50 MG PO TABS
75.0000 mg | ORAL_TABLET | Freq: Every day | ORAL | Status: DC
  Administered 2016-02-19: 75 mg via ORAL
  Filled 2016-02-19 (×3): qty 1

## 2016-02-19 MED ORDER — CEFUROXIME AXETIL 250 MG PO TABS
250.0000 mg | ORAL_TABLET | Freq: Once | ORAL | Status: AC
  Administered 2016-02-19: 250 mg via ORAL
  Filled 2016-02-19: qty 1

## 2016-02-19 MED ORDER — GABAPENTIN 100 MG PO CAPS
100.0000 mg | ORAL_CAPSULE | Freq: Two times a day (BID) | ORAL | Status: DC
  Administered 2016-02-19: 100 mg via ORAL
  Filled 2016-02-19: qty 1

## 2016-02-19 MED ORDER — QUETIAPINE FUMARATE 25 MG PO TABS
ORAL_TABLET | ORAL | Status: AC
  Filled 2016-02-19: qty 3
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
## 2016-02-19 NOTE — ED Notes (Signed)
Patient's granddaughter called and left name and number to call for update. Name is Doylene Canardrin Namuel, number is [phone].

## 2016-02-19 NOTE — ED Notes (Signed)
Went in to do vitals patient was asleep after being very combative and given meds. Was unable to do vitals made nurse aware.

## 2016-02-19 NOTE — ED Notes (Signed)
Patient has bruising noted to left eye and swelling above left eye on forehead. C-collar in place by EMS upon arrival to ER. Patient tearful at triage.

## 2016-02-19 NOTE — ED Notes (Signed)
Patient continues to remove c-collar. After several attempts at replacing c-collar, patient still removed. C-collar not replaced at this time. Dr Lynelle DoctorKnapp aware.

## 2016-02-19 NOTE — ED Notes (Signed)
Attempted IV status x 2 unsuccessful.

## 2016-02-19 NOTE — ED Notes (Signed)
Assisted [facility] with an in and out cath.

## 2016-02-19 NOTE — ED Triage Notes (Signed)
[facility] has had several recent falls.  Pt arrives tonight via EMS for another fall where she hit the left side of her forehead and has swelling to above her left eye. Unknown if loc.

## 2016-02-19 NOTE — ED Notes (Signed)
[facility]. States she is unable to come pick up patient due to staffing issues. Deloris called back and stated she called family to pick up patient but "they said they don't feel safe transporting her back here." Called EMS to transport patient back to facility.

## 2016-02-19 NOTE — ED Notes (Signed)
Patient removed C-collar. Replaced by myself and another nurse. C-spine maintained during application of c-collar.

## 2016-02-19 NOTE — ED Provider Notes (Signed)
[facility]-EM[facility]Y DEPT Provider Note   CSN: [id number] Arrival date & time: 02/19/16  1904     History   Chief Complaint Chief Complaint  Patient presents with  . Fall    HPI Veronica Hood is a 82 y.o. female.  HPI Patient presents to the emergency room for evaluation of a fall. Patient has dementia and the history is limited. The patient is unable to tell me what happened this evening. She continually asks me for help but can't specifically tell me what's bothering her. According to the EMS report, the patient had another fall at the nursing facility today. She does have a history of frequent falls and dementia. Past Medical History:  Diagnosis Date  . Alzheimer's dementia   . Anxiety   . Depression   . Frequent falls   . Migraines     Patient Active Problem List   Diagnosis Date Noted  . Altered mental status 02/12/2016  . HCAP (healthcare-associated pneumonia) 02/12/2016    History reviewed. No pertinent surgical history.  OB History    No data available       Home Medications    Prior to Admission medications   Medication Sig Start Date End Date Taking? Authorizing Provider  acetaminophen (TYLENOL) 325 MG tablet Take 650 mg by mouth every 6 (six) hours as needed for mild pain or moderate pain.   Yes Historical Provider, MD  acetaminophen (TYLENOL) 500 MG tablet Take 1,000 mg by mouth 2 (two) times daily.   Yes Historical Provider, MD  benzonatate (TESSALON) 100 MG capsule Take 100 mg by mouth 2 (two) times daily as needed for cough.   Yes Historical Provider, MD  cholecalciferol (VITAMIN D) 1000 units tablet Take 1,000 Units by mouth daily.   Yes Historical Provider, MD  Cranberry 450 MG TABS Take 1 tablet by mouth daily.   Yes Historical Provider, MD  docusate sodium (COLACE) 100 MG capsule Take 100 mg by mouth 2 (two) times daily.   Yes Historical Provider, MD  escitalopram (LEXAPRO) 20 MG tablet Take 20 mg by mouth daily.    Yes Historical Provider, MD    gabapentin (NEURONTIN) 100 MG capsule Take 100 mg by mouth 2 (two) times daily.   Yes Historical Provider, MD  ibuprofen (ADVIL,MOTRIN) 600 MG tablet Take 600 mg by mouth daily as needed for headache.   Yes Historical Provider, MD  LORazepam (ATIVAN) 0.5 MG tablet Take 0.5-1.5 mg by mouth 3 (three) times daily. *May take one-half tablet twice daily as needed for anxiety    Yes Historical Provider, MD  Melatonin 3 MG TABS Take 1 tablet by mouth at bedtime.   Yes Historical Provider, MD  Phenylephrine-DM-GG (ROBITUSSIN CHILD COUGH/COLD CF) 2.10-22-48 MG/5ML LIQD Take 20 mLs by mouth 3 (three) times daily as needed (for cough and cold).   Yes Historical Provider, MD  QUEtiapine (SEROQUEL) 50 MG tablet Take 75 mg by mouth at bedtime.    Yes Historical Provider, MD  ranitidine (ZANTAC) 150 MG tablet Take 150 mg by mouth 2 (two) times daily.   Yes Historical Provider, MD  traZODone (DESYREL) 50 MG tablet Take 25 mg by mouth at bedtime.   Yes Historical Provider, MD  cefUROXime (CEFTIN) 250 MG tablet Take 1 tablet (250 mg total) by mouth 2 (two) times daily with a meal. 02/19/16   Linwood Dibbles, MD    Family History Family History  Problem Relation Age of Onset  . Alzheimer's disease Father     Social History Social  History  Substance Use Topics  . Smoking status: Never Smoker  . Smokeless tobacco: Never Used  . Alcohol use No     Allergies   Sulfa antibiotics and Penicillins   Review of Systems Review of Systems  All other systems reviewed and are negative.    Physical Exam Updated Vital Signs BP 156/78 (BP Location: Left Arm)   Pulse 86   Temp 98.3 F (36.8 C) (Oral)   Resp 20   Ht 5\' 5"  (1.651 m)   Wt 73.9 kg   SpO2 100%   BMI 27.12 kg/m   Physical Exam  Constitutional: No distress.  Elderly, frail  HENT:  Head: Normocephalic.  Right Ear: External ear normal.  Left Ear: External ear normal.  Old bruise around the left periorbital region  Eyes: Conjunctivae are normal.  Right eye exhibits no discharge. Left eye exhibits no discharge. No scleral icterus.  Neck: Neck supple. No tracheal deviation present.  Cardiovascular: Normal rate, regular rhythm and intact distal pulses.   Pulmonary/Chest: Effort normal and breath sounds normal. No stridor. No respiratory distress. She has no wheezes. She has no rales.  Abdominal: Soft. Bowel sounds are normal. She exhibits no distension. There is no tenderness. There is no rebound and no guarding.  Musculoskeletal: She exhibits no edema or tenderness.       Right shoulder: She exhibits no tenderness, no bony tenderness and no swelling.       Left shoulder: She exhibits no tenderness, no bony tenderness and no swelling.       Right wrist: She exhibits no tenderness, no bony tenderness and no swelling.       Left wrist: She exhibits no tenderness, no bony tenderness and no swelling.       Right hip: She exhibits normal range of motion, no tenderness, no bony tenderness and no swelling.       Left hip: She exhibits normal range of motion, no tenderness and no bony tenderness.       Right ankle: She exhibits no swelling. No tenderness.       Left ankle: She exhibits no swelling. No tenderness.       Cervical back: She exhibits no tenderness, no bony tenderness and no swelling.       Thoracic back: She exhibits no tenderness, no bony tenderness and no swelling.       Lumbar back: She exhibits no tenderness, no bony tenderness and no swelling.  Neurological: She is alert. She has normal strength. No cranial nerve deficit (no facial droop, extraocular movements intact, no slurred speech) or sensory deficit. She exhibits normal muscle tone. She displays no seizure activity. Coordination normal.  Skin: Skin is warm and dry. No rash noted.  Psychiatric: She has a normal mood and affect.  Nursing note and vitals reviewed.    ED Treatments / Results  Labs (all labs ordered are listed, but only abnormal results are displayed) Labs  Reviewed  CBC WITH DIFFERENTIAL/PLATELET - Abnormal; Notable for the following:       Result Value   WBC 12.1 (*)    Lymphs Abs 6.5 (*)    All other components within normal limits  BASIC METABOLIC PANEL - Abnormal; Notable for the following:    Glucose, Bld 112 (*)    BUN 26 (*)    Calcium 8.8 (*)    GFR calc non Af Amer 51 (*)    GFR calc Af Amer 59 (*)    All other components within  normal limits  URINALYSIS, ROUTINE W REFLEX MICROSCOPIC (NOT AT [facility]) - Abnormal; Notable for the following:    Specific Gravity, Urine >1.030 (*)    Bilirubin Urine SMALL (*)    Ketones, ur 15 (*)    Protein, ur 30 (*)    All other components within normal limits  URINE MICROSCOPIC-ADD ON - Abnormal; Notable for the following:    Squamous Epithelial / LPF 0-5 (*)    Bacteria, UA FEW (*)    All other components within normal limits  URINE CULTURE    EKG  EKG Interpretation  Date/Time:  Friday February 19 2016 19:45:42 EDT Ventricular Rate:  78 PR Interval:    QRS Duration: 84 QT Interval:  388 QTC Calculation: 442 R Axis:   17 Text Interpretation:  Sinus rhythm Borderline low voltage, extremity leads Confirmed by Kassaundra Hair  MD-J, Aprel Egelhoff ([id number]) on 02/19/2016 8:02:33 PM       Radiology Ct Head Wo Contrast  Result Date: 02/19/2016 CLINICAL DATA:  82 year old female with head and neck injury following fall today. EXAM: CT HEAD WITHOUT CONTRAST CT CERVICAL SPINE WITHOUT CONTRAST TECHNIQUE: Multidetector CT imaging of the head and cervical spine was performed following the standard protocol without intravenous contrast. Multiplanar CT image reconstructions of the cervical spine were also generated. COMPARISON:  02/18/2016 and multiple prior CTs FINDINGS: CT HEAD FINDINGS Mild atrophy and moderate to severe chronic small-vessel white matter ischemic changes again noted. No acute intracranial abnormalities are identified, including mass lesion or mass effect, hydrocephalus, extra-axial fluid collection,  midline shift, hemorrhage, or acute infarction. Left forehead soft tissue swelling noted without fracture. The visualized bony calvarium is unremarkable. CT CERVICAL SPINE FINDINGS There is no evidence of acute fracture, subluxation or prevertebral soft tissue swelling. Mild multilevel degenerative disc disease and spondylosis and moderate multilevel facet arthropathy again noted. No suspicious focal bony lesions are noted. No soft tissue abnormalities are present. IMPRESSION: No evidence of acute intracranial abnormality. Atrophy chronic small-vessel white matter ischemic changes. Left forehead soft tissue swelling without fracture. No static evidence of acute injury to the cervical spine. Electronically Signed   By: Harmon Pier M.D.   On: 02/19/2016 21:44   Ct Head Wo Contrast  Result Date: 02/18/2016 CLINICAL DATA:  82 year old female with history of trauma from a fall. EXAM: CT HEAD WITHOUT CONTRAST CT CERVICAL SPINE WITHOUT CONTRAST TECHNIQUE: Multidetector CT imaging of the head and cervical spine was performed following the standard protocol without intravenous contrast. Multiplanar CT image reconstructions of the cervical spine were also generated. COMPARISON:  Head CT and cervical spine CT 02/14/2016. FINDINGS: CT HEAD FINDINGS Mild cerebral atrophy. Patchy and confluent areas of decreased attenuation are noted throughout the deep and periventricular white matter of the cerebral hemispheres bilaterally, compatible with chronic microvascular ischemic disease. No acute displaced skull fractures are identified. No acute intracranial abnormality. Specifically, no evidence of acute post-traumatic intracranial hemorrhage, no definite regions of acute/subacute cerebral ischemia, no focal mass, mass effect, hydrocephalus or abnormal intra or extra-axial fluid collections. The visualized paranasal sinuses and mastoids are well pneumatized. CT CERVICAL SPINE FINDINGS Study is limited by considerable patient  motion, despite repeat examination. With these limitations in mind, the previously described oblique lucency through the left side of the C6 vertebral body is again noted, and apparent on multiple adjacent images (sagittal series 15 image 24-27). Notably, this lucency was not evident on more remote prior cervical spine CT 02/12/2016, which was not compromised by motion. Unfortunately, secondary to motion, a fracture  cannot be confirmed on axial or orthogonal images. No other potential displaced cervical spine fractures are noted. There is some reversal of normal cervical lordosis centered at C5, presumably positional. 3 mm of anterolisthesis of C3 upon C4. Multilevel degenerative disc disease, most severe at C6-C7. Severe multilevel facet arthropathy. Visualized portions of the upper thorax demonstrate emphysematous changes and atherosclerosis in the thoracic aorta. IMPRESSION: 1. Evaluation of the cervical spine is again limited by motion, but there is a persistent oblique lucency through the left lateral aspect of C6 vertebral body concerning for (but not definitive for) a nondisplaced fracture. This is very similar to the prior examination from 02/14/2016. 2. No new fracture identified. 3. Multilevel degenerative disc disease and cervical spondylosis, as above, similar to prior examinations. 4. No evidence of significant acute traumatic injury to the skull or brain. 5. Cerebral atrophy with severe chronic microvascular ischemic changes in the cerebral white matter, as above. These results were called by telephone at the time of interpretation on 02/18/2016 at 4:50 pm to Dr. Samuel JesterKATHLEEN MCMANUS, who verbally acknowledged these results. Electronically Signed   By: Trudie Reedaniel  Entrikin M.D.   On: 02/18/2016 16:54   Ct Cervical Spine Wo Contrast  Result Date: 02/19/2016 CLINICAL DATA:  82 year old female with head and neck injury following fall today. EXAM: CT HEAD WITHOUT CONTRAST CT CERVICAL SPINE WITHOUT CONTRAST  TECHNIQUE: Multidetector CT imaging of the head and cervical spine was performed following the standard protocol without intravenous contrast. Multiplanar CT image reconstructions of the cervical spine were also generated. COMPARISON:  02/18/2016 and multiple prior CTs FINDINGS: CT HEAD FINDINGS Mild atrophy and moderate to severe chronic small-vessel white matter ischemic changes again noted. No acute intracranial abnormalities are identified, including mass lesion or mass effect, hydrocephalus, extra-axial fluid collection, midline shift, hemorrhage, or acute infarction. Left forehead soft tissue swelling noted without fracture. The visualized bony calvarium is unremarkable. CT CERVICAL SPINE FINDINGS There is no evidence of acute fracture, subluxation or prevertebral soft tissue swelling. Mild multilevel degenerative disc disease and spondylosis and moderate multilevel facet arthropathy again noted. No suspicious focal bony lesions are noted. No soft tissue abnormalities are present. IMPRESSION: No evidence of acute intracranial abnormality. Atrophy chronic small-vessel white matter ischemic changes. Left forehead soft tissue swelling without fracture. No static evidence of acute injury to the cervical spine. Electronically Signed   By: Harmon PierJeffrey  Hu M.D.   On: 02/19/2016 21:44   Ct Cervical Spine Wo Contrast  Result Date: 02/18/2016 CLINICAL DATA:  82 year old female with neck pain following fall. Equivocal C6 fracture on prior CTs but motion noted on those examinations. EXAM: CT CERVICAL SPINE WITHOUT CONTRAST TECHNIQUE: Multidetector CT imaging of the cervical spine was performed without intravenous contrast. Multiplanar CT image reconstructions were also generated. COMPARISON:  02/18/2016, 02/14/2016 and prior CTs FINDINGS: There is no evidence of acute fracture or prevertebral soft tissue swelling. No fracture at C6 identified. 1-2 mm spondylolisthesis at C3-4, C4-5 and C5-6 is chronic. Mild multilevel  facet arthropathy and degenerative disc disease noted. No suspicious focal bony lesions are identified. The soft tissue structures are unremarkable. IMPRESSION: No static evidence of acute injury to the cervical spine. No evidence of C6 fracture. Mild multilevel facet arthropathy and degenerative disc disease. Electronically Signed   By: Harmon PierJeffrey  Hu M.D.   On: 02/18/2016 19:07   Ct Cervical Spine Wo Contrast  Result Date: 02/18/2016 CLINICAL DATA:  82 year old female with history of trauma from a fall. EXAM: CT HEAD WITHOUT CONTRAST CT CERVICAL SPINE  WITHOUT CONTRAST TECHNIQUE: Multidetector CT imaging of the head and cervical spine was performed following the standard protocol without intravenous contrast. Multiplanar CT image reconstructions of the cervical spine were also generated. COMPARISON:  Head CT and cervical spine CT 02/14/2016. FINDINGS: CT HEAD FINDINGS Mild cerebral atrophy. Patchy and confluent areas of decreased attenuation are noted throughout the deep and periventricular white matter of the cerebral hemispheres bilaterally, compatible with chronic microvascular ischemic disease. No acute displaced skull fractures are identified. No acute intracranial abnormality. Specifically, no evidence of acute post-traumatic intracranial hemorrhage, no definite regions of acute/subacute cerebral ischemia, no focal mass, mass effect, hydrocephalus or abnormal intra or extra-axial fluid collections. The visualized paranasal sinuses and mastoids are well pneumatized. CT CERVICAL SPINE FINDINGS Study is limited by considerable patient motion, despite repeat examination. With these limitations in mind, the previously described oblique lucency through the left side of the C6 vertebral body is again noted, and apparent on multiple adjacent images (sagittal series 15 image 24-27). Notably, this lucency was not evident on more remote prior cervical spine CT 02/12/2016, which was not compromised by motion.  Unfortunately, secondary to motion, a fracture cannot be confirmed on axial or orthogonal images. No other potential displaced cervical spine fractures are noted. There is some reversal of normal cervical lordosis centered at C5, presumably positional. 3 mm of anterolisthesis of C3 upon C4. Multilevel degenerative disc disease, most severe at C6-C7. Severe multilevel facet arthropathy. Visualized portions of the upper thorax demonstrate emphysematous changes and atherosclerosis in the thoracic aorta. IMPRESSION: 1. Evaluation of the cervical spine is again limited by motion, but there is a persistent oblique lucency through the left lateral aspect of C6 vertebral body concerning for (but not definitive for) a nondisplaced fracture. This is very similar to the prior examination from 02/14/2016. 2. No new fracture identified. 3. Multilevel degenerative disc disease and cervical spondylosis, as above, similar to prior examinations. 4. No evidence of significant acute traumatic injury to the skull or brain. 5. Cerebral atrophy with severe chronic microvascular ischemic changes in the cerebral white matter, as above. These results were called by telephone at the time of interpretation on 02/18/2016 at 4:50 pm to Dr. Samuel Jester, who verbally acknowledged these results. Electronically Signed   By: Trudie Reed M.D.   On: 02/18/2016 16:54    Procedures Procedures (including critical care time)  Medications Ordered in ED Medications  cefUROXime (CEFTIN) tablet 250 mg (not administered)  LORazepam (ATIVAN) tablet 0.5 mg (0.5 mg Oral Given 02/19/16 2134)     Initial Impression / Assessment and Plan / ED Course  I have reviewed the triage vital signs and the nursing notes.  Pertinent labs & imaging results that were available during my care of the patient were reviewed by me and considered in my medical decision making (see chart for details).  Clinical Course    CT scans are negative.  UA does  suggest a UTI.  Will dc home on ceftin.  Has a pcn allergy but will give a dose in the ED.  Final Clinical Impressions(s) / ED Diagnoses   Final diagnoses:  Fall, initial encounter  UTI (lower urinary tract infection)    New Prescriptions New Prescriptions   CEFUROXIME (CEFTIN) 250 MG TABLET    Take 1 tablet (250 mg total) by mouth 2 (two) times daily with a meal.     Linwood Dibbles, MD 02/19/16 2238

## 2016-02-22 LAB — URINE CULTURE: CULTURE: NO GROWTH

## 2016-02-23 ENCOUNTER — Emergency Department (HOSPITAL_COMMUNITY): Payer: Medicare Other

## 2016-02-23 ENCOUNTER — Emergency Department (HOSPITAL_COMMUNITY)
Admission: EM | Admit: 2016-02-23 | Discharge: 2016-02-23 | Disposition: A | Discharge status: Home / Self Care | Payer: Medicare Other | Attending: Emergency Medicine | Admitting: Emergency Medicine

## 2016-02-23 ENCOUNTER — Encounter (HOSPITAL_COMMUNITY): Payer: Self-pay

## 2016-02-23 DIAGNOSIS — Z79899 Other long term (current) drug therapy: Secondary | ICD-10-CM | POA: Insufficient documentation

## 2016-02-23 DIAGNOSIS — W19XXXA Unspecified fall, initial encounter: Secondary | ICD-10-CM | POA: Diagnosis not present

## 2016-02-23 DIAGNOSIS — Y939 Activity, unspecified: Secondary | ICD-10-CM | POA: Insufficient documentation

## 2016-02-23 DIAGNOSIS — Y9289 Other specified places as the place of occurrence of the external cause: Secondary | ICD-10-CM | POA: Insufficient documentation

## 2016-02-23 DIAGNOSIS — S0990XA Unspecified injury of head, initial encounter: Secondary | ICD-10-CM | POA: Diagnosis present

## 2016-02-23 DIAGNOSIS — F0280 Dementia in other diseases classified elsewhere without behavioral disturbance: Secondary | ICD-10-CM | POA: Insufficient documentation

## 2016-02-23 DIAGNOSIS — S43014A Anterior dislocation of right humerus, initial encounter: Secondary | ICD-10-CM | POA: Diagnosis not present

## 2016-02-23 DIAGNOSIS — Y999 Unspecified external cause status: Secondary | ICD-10-CM | POA: Diagnosis not present

## 2016-02-23 DIAGNOSIS — F329 Major depressive disorder, single episode, unspecified: Secondary | ICD-10-CM | POA: Insufficient documentation

## 2016-02-23 DIAGNOSIS — G308 Other Alzheimer's disease: Secondary | ICD-10-CM | POA: Insufficient documentation

## 2016-02-23 DIAGNOSIS — S0101XA Laceration without foreign body of scalp, initial encounter: Secondary | ICD-10-CM | POA: Diagnosis not present

## 2016-02-23 DIAGNOSIS — S43004A Unspecified dislocation of right shoulder joint, initial encounter: Secondary | ICD-10-CM

## 2016-02-23 DIAGNOSIS — Z791 Long term (current) use of non-steroidal anti-inflammatories (NSAID): Secondary | ICD-10-CM | POA: Diagnosis not present

## 2016-02-23 MED ORDER — LIDOCAINE-EPINEPHRINE (PF) 1 %-1:200000 IJ SOLN
30.0000 mL | Freq: Once | INTRAMUSCULAR | Status: AC
  Administered 2016-02-23: 30 mL via INTRADERMAL
  Filled 2016-02-23: qty 30

## 2016-02-23 MED ORDER — PROPOFOL 10 MG/ML IV BOLUS
INTRAVENOUS | Status: AC | PRN
  Administered 2016-02-23: 50 mg via INTRAVENOUS

## 2016-02-23 MED ORDER — LIDOCAINE-EPINEPHRINE (PF) 1 %-1:200000 IJ SOLN: 20.0000 mL | Freq: Once | INTRAMUSCULAR | Status: DC

## 2016-02-23 MED ORDER — MORPHINE SULFATE (PF) 4 MG/ML IV SOLN
4.0000 mg | INTRAVENOUS | Status: DC | PRN
  Administered 2016-02-23: 4 mg via INTRAVENOUS
  Filled 2016-02-23: qty 1

## 2016-02-23 MED ORDER — ONDANSETRON HCL 4 MG/2ML IJ SOLN
4.0000 mg | Freq: Once | INTRAMUSCULAR | Status: AC
  Administered 2016-02-23: 4 mg via INTRAVENOUS
  Filled 2016-02-23: qty 2

## 2016-02-23 MED ORDER — LIDOCAINE-EPINEPHRINE (PF) 1 %-1:200000 IJ SOLN
INTRAMUSCULAR | Status: AC
  Filled 2016-02-23: qty 30

## 2016-02-23 MED ORDER — PROPOFOL 10 MG/ML IV BOLUS
200.0000 mg | Freq: Once | INTRAVENOUS | Status: AC
  Administered 2016-02-23: 200 mg via INTRAVENOUS
  Filled 2016-02-23: qty 20

## 2016-02-23 NOTE — ED Notes (Addendum)
Veronica Hood, pt son,  [phone]

## 2016-02-23 NOTE — Sedation Documentation (Signed)
Dr. Fayrene FearingJames, Harmon Pier. Amalea Ottey RN and L. Roxan Hockeyobinson RN at bedside.

## 2016-02-23 NOTE — ED Notes (Addendum)
Per shift report, pt was yelling and agitated at time of arrival. Pt has intermittent episodes of apnea,arousable to voice, but not back at baseline at this time. Cardiac monitor and CO2 monitor remain in place at this time. VS still being obtained every 15 minutes. EDP aware. EDP reported to notify when pt back at baseline. Discharge paperwork at nurses station.

## 2016-02-23 NOTE — Sedation Documentation (Signed)
Right shoulder reduction complete.

## 2016-02-23 NOTE — ED Provider Notes (Signed)
[facility]Y DEPT Provider Note   CSN: [id number] Arrival date & time: 02/23/16  0751     History   Chief Complaint Chief Complaint  Patient presents with  . Fall    head injury    HPI Veronica Hood is a 82 y.o. female. [facility]. I had another fall this morning. Has laceration to the right for head and shoulder pain. History of Alzheimer's dementia. Unable to offer any additional details of her history or injury  HPI  Past Medical History:  Diagnosis Date  . Alzheimer's dementia   . Anxiety   . Depression   . Frequent falls   . Migraines     Patient Active Problem List   Diagnosis Date Noted  . Altered mental status 02/12/2016  . HCAP (healthcare-associated pneumonia) 02/12/2016    History reviewed. No pertinent surgical history.  OB History    No data available       Home Medications    Prior to Admission medications   Medication Sig Start Date End Date Taking? Authorizing Provider  acetaminophen (TYLENOL) 325 MG tablet Take 650 mg by mouth every 6 (six) hours as needed for mild pain or moderate pain.   Yes Historical Provider, MD  acetaminophen (TYLENOL) 500 MG tablet Take 1,000 mg by mouth 2 (two) times daily.   Yes Historical Provider, MD  benzonatate (TESSALON) 100 MG capsule Take 100 mg by mouth 2 (two) times daily as needed for cough.   Yes Historical Provider, MD  cefUROXime (CEFTIN) 250 MG tablet Take 1 tablet (250 mg total) by mouth 2 (two) times daily with a meal. 02/19/16  Yes Linwood DibblesJon Knapp, MD  docusate sodium (COLACE) 100 MG capsule Take 100 mg by mouth 2 (two) times daily.   Yes Historical Provider, MD  donepezil (ARICEPT) 5 MG tablet Take 5 mg by mouth at bedtime.   Yes Historical Provider, MD  gabapentin (NEURONTIN) 100 MG capsule Take 100 mg by mouth 2 (two) times daily.   Yes Historical Provider, MD  ibuprofen (ADVIL,MOTRIN) 600 MG tablet Take 600 mg by mouth daily as needed for headache.    Yes Historical Provider, MD  LORazepam (ATIVAN) 0.5 MG tablet Take 0.5 mg by mouth daily as needed for anxiety. *May take one-half tablet twice daily as needed for anxiety    Yes Historical Provider, MD  LORazepam (ATIVAN) 1 MG tablet Take 1 mg by mouth every 6 (six) hours.   Yes Historical Provider, MD  Melatonin 3 MG TABS Take 1 tablet by mouth at bedtime.   Yes Historical Provider, MD  Phenylephrine-DM-GG (ROBITUSSIN CHILD COUGH/COLD CF) 2.10-22-48 MG/5ML LIQD Take 20 mLs by mouth 3 (three) times daily as needed (for cough and cold).   Yes Historical Provider, MD  QUEtiapine (SEROQUEL) 50 MG tablet Take 50-100 mg by mouth 2 (two) times daily. 50 mg in the morning and 100 mg at bedtime.   Yes Historical Provider, MD  ranitidine (ZANTAC) 150 MG tablet Take 150 mg by mouth 2 (two) times daily.   Yes Historical Provider, MD  risperiDONE microspheres (RISPERDAL CONSTA) 25 MG injection Inject 25 mg into the muscle every 14 (fourteen) days.   Yes Historical Provider, MD  cholecalciferol (VITAMIN D) 1000 units tablet Take 1,000 Units by mouth daily.    Historical Provider, MD  Cranberry 450 MG TABS Take 1 tablet by mouth daily.    Historical Provider, MD  escitalopram (LEXAPRO) 20 MG tablet Take 20 mg by mouth  daily.     Historical Provider, MD  levofloxacin (LEVAQUIN) 750 MG tablet Take 750 mg by mouth daily.    Historical Provider, MD  traZODone (DESYREL) 50 MG tablet Take 25 mg by mouth at bedtime.    Historical Provider, MD    Family History Family History  Problem Relation Age of Onset  . Alzheimer's disease Father     Social History Social History  Substance Use Topics  . Smoking status: Never Smoker  . Smokeless tobacco: Never Used  . Alcohol use No     Allergies   Sulfa antibiotics and Penicillins   Review of Systems Review of Systems  Unable to perform ROS: Dementia     Physical Exam Updated Vital Signs BP 119/61   Pulse 72   Temp 98.1 F (36.7 C)   Resp 14   SpO2  100%   Physical Exam  Constitutional: She appears well-developed and well-nourished. No distress.  HENT:  Head: Normocephalic.    Eyes: Conjunctivae are normal. Pupils are equal, round, and reactive to light. No scleral icterus.  Neck: Normal range of motion. Neck supple. No thyromegaly present.  Cardiovascular: Normal rate and regular rhythm.  Exam reveals no gallop and no friction rub.   No murmur heard. Pulmonary/Chest: Effort normal and breath sounds normal. No respiratory distress. She has no wheezes. She has no rales.  Abdominal: Soft. Bowel sounds are normal. She exhibits no distension. There is no tenderness. There is no rebound.  Musculoskeletal: Normal range of motion.       Arms: Neurological: She is alert.  Skin: Skin is warm and dry. No rash noted.  Psychiatric: She has a normal mood and affect. Her behavior is normal.     ED Treatments / Results  Labs (all labs ordered are listed, but only abnormal results are displayed) Labs Reviewed - No data to display  EKG  EKG Interpretation None       Radiology Dg Shoulder Right  Result Date: 02/23/2016 CLINICAL DATA:  Pain following fall EXAM: RIGHT SHOULDER - 2+ VIEW COMPARISON:  None. FINDINGS: Frontal and Y scapular images were obtained. There is a subcoracoid anterior dislocation. No fracture evident. There is mild osteoarthritic change in the acromioclavicular joint. Visualized right lung is clear. IMPRESSION: Subcoracoid anterior dislocation.  No fracture appreciable. Electronically Signed   By: Bretta Bang III M.D.   On: 02/23/2016 09:08   Ct Head Wo Contrast  Result Date: 02/23/2016 CLINICAL DATA:  Status post fall.  Hit right side of forehead. EXAM: CT HEAD WITHOUT CONTRAST CT CERVICAL SPINE WITHOUT CONTRAST TECHNIQUE: Multidetector CT imaging of the head and cervical spine was performed following the standard protocol without intravenous contrast. Multiplanar CT image reconstructions of the cervical spine  were also generated. COMPARISON:  02/19/2016 FINDINGS: CT HEAD FINDINGS There is diffuse low attenuation throughout the subcortical and periventricular white matter compatible with chronic microvascular disease and brain atrophy. Prominence of the sulci and ventricles identified compatible with brain atrophy. No evidence for acute intracranial hemorrhage, cortical infarct or mass. The osseous skull is intact. The paranasal sinuses and mastoid air cells are clear. Small left frontal scalp hematoma is identified. CT CERVICAL SPINE FINDINGS Normal alignment of the cervical spine. There is multi level degenerative disc disease and ventral endplate spurring. Most advanced at C5-6 and C6-7. The facet joints scratch set bilateral facet hypertrophy and degenerative change is identified. IMPRESSION: 1. No acute intracranial abnormality. 2. Small vessel ischemic change and brain atrophy. 3. Cervical spondylosis without  evidence for fracture or subluxation. 4. Left frontal scalp hematoma. Electronically Signed   By: Signa Kell M.D.   On: 02/23/2016 09:03   Ct Cervical Spine Wo Contrast  Result Date: 02/23/2016 CLINICAL DATA:  Status post fall.  Hit right side of forehead. EXAM: CT HEAD WITHOUT CONTRAST CT CERVICAL SPINE WITHOUT CONTRAST TECHNIQUE: Multidetector CT imaging of the head and cervical spine was performed following the standard protocol without intravenous contrast. Multiplanar CT image reconstructions of the cervical spine were also generated. COMPARISON:  02/19/2016 FINDINGS: CT HEAD FINDINGS There is diffuse low attenuation throughout the subcortical and periventricular white matter compatible with chronic microvascular disease and brain atrophy. Prominence of the sulci and ventricles identified compatible with brain atrophy. No evidence for acute intracranial hemorrhage, cortical infarct or mass. The osseous skull is intact. The paranasal sinuses and mastoid air cells are clear. Small left frontal scalp  hematoma is identified. CT CERVICAL SPINE FINDINGS Normal alignment of the cervical spine. There is multi level degenerative disc disease and ventral endplate spurring. Most advanced at C5-6 and C6-7. The facet joints scratch set bilateral facet hypertrophy and degenerative change is identified. IMPRESSION: 1. No acute intracranial abnormality. 2. Small vessel ischemic change and brain atrophy. 3. Cervical spondylosis without evidence for fracture or subluxation. 4. Left frontal scalp hematoma. Electronically Signed   By: Signa Kell M.D.   On: 02/23/2016 09:03   Dg Shoulder Right Portable  Result Date: 02/23/2016 CLINICAL DATA:  Post reduction EXAM: PORTABLE RIGHT SHOULDER - 2+ VIEW COMPARISON:  Study obtained earlier in the day FINDINGS: Frontal and Y scapular images were obtained. The previously noted anterior dislocation has been reduced successfully. Currently there is no evident fracture or dislocation. There is mild generalized osteoarthritic change. No erosive change. A calcified breast implant on the right is noted incidentally. IMPRESSION: Successful reduction of prior anterior dislocation. Currently no fracture or dislocation. Mild generalized osteoarthritic change noted. Electronically Signed   By: Bretta Bang III M.D.   On: 02/23/2016 10:41    Procedures Procedures (including critical care time)  Medications Ordered in ED Medications  morphine 4 MG/ML injection 4 mg (4 mg Intravenous Given 02/23/16 0812)  lidocaine-EPINEPHrine (XYLOCAINE-EPINEPHrine) 1 %-1:200000 (PF) injection (not administered)  ondansetron (ZOFRAN) injection 4 mg (4 mg Intravenous Given 02/23/16 0812)  propofol (DIPRIVAN) 10 mg/mL bolus/IV push 200 mg (200 mg Intravenous Given by Other 02/23/16 0930)  lidocaine-EPINEPHrine (XYLOCAINE-EPINEPHrine) 1 %-1:200000 (PF) injection 30 mL (30 mLs Intradermal Given by Other 02/23/16 0930)  propofol (DIPRIVAN) 10 mg/mL bolus/IV push (50 mg Intravenous Given 02/23/16 1000)      Initial Impression / Assessment and Plan / ED Course  I have reviewed the triage vital signs and the nursing notes.  Pertinent labs & imaging results that were available during my care of the patient were reviewed by me and considered in my medical decision making (see chart for details).  Clinical Course    CONSULT NOTE: Cherisa Brucker presents for an initial visit to discuss breast reduction.  The patient complains of back and neck pain and complications consistent with symptomatic hypermastia.  Discussion has included the risks which include bleeding, infection, damage to other nerves and vessel structures, nipple grafting, loss of nipple, scarring, asymmetries, need for re-operations, inability to breast feed, and unacceptable cosmetic results.  The patient understands these risks and believes that the benefits outweigh the risks of the procedure.  The patient is a good candidate for the procedure and has been educated on the technique  and usual peri-operative course.    Past Medical History: Past Medical History:  Diagnosis Date  . Alzheimer's dementia   . Anxiety   . Depression   . Frequent falls   . Migraines     Past Surgical History: History reviewed. No pertinent surgical history.  Social History: Social History  Substance Use Topics  . Smoking status: Never Smoker  . Smokeless tobacco: Never Used  . Alcohol use No    Allergies: Sulfa antibiotics and Penicillins  Current Medications: Scheduled Meds: . lidocaine-EPINEPHrine       Procedural sedation Performed by: Claudean Kinds Consent: Verbal consent obtained. Risks and benefits: risks, benefits and alternatives were discussed Required items: required blood products, implants, devices, and special equipment available Patient identity confirmed: arm band and provided demographic data Time out: Immediately prior to procedure a "time out" was called to verify the correct patient, procedure, equipment,  support staff and site/side marked as required.  Sedation type: moderate (conscious) sedation NPO time confirmed and considedered  Sedatives: PROPOFOL  Physician Time at Bedside: 25 minutes  Vitals: Vital signs were monitored during sedation. Cardiac Monitor, pulse oximeter Patient tolerance: Patient tolerated the procedure well with no immediate complications. Comments: Pt with uneventful recovered. Returned to pre-procedural sedation baseline  Reduction of dislocation Date/Time: 10:00am Performed by: Claudean Kinds Authorized by: Claudean Kinds Consent: Verbal consent obtained. Risks and benefits: risks, benefits and alternatives were discussed Consent given by: patient Required items: required blood products, implants, devices, and special equipment available Time out: Immediately prior to procedure a "time out" was called to verify the correct patient, procedure, equipment, support staff and site/side marked as required.  Patient sedated: Yes  Vitals: Vital signs were monitored during sedation. Patient tolerance: Patient tolerated the procedure well with no immediate complications. Joint: right shoulder Reduction technique: Scapular manipulation, axial traction at 45 degrees  LACERATION REPAIR Performed by: Claudean Kinds Authorized by: Claudean Kinds Consent: Verbal consent obtained. Risks and benefits: risks, benefits and alternatives were discussed Consent given by: patient Patient identity confirmed: provided demographic data Prepped and Draped in normal sterile fashion Wound explored  Laceration Location: Right forehead  Laceration Length: 3cm  No Foreign Bodies seen or palpated  Anesthesia: local infiltration  Local anesthetic: lidocaine 1% 1 epinephrine  Anesthetic total: c ml  Irrigation method: syringe Amount of cleaning: standard  Skin closure: 3  Number of sutures: 5  Technique: Running locking  Patient tolerance: Patient  tolerated the procedure well with no immediate complications.     Final Clinical Impressions(s) / ED Diagnoses   Final diagnoses:  Shoulder dislocation, right, initial encounter  Scalp laceration, initial encounter    New Prescriptions New Prescriptions   No medications on file     Rolland Porter, MD 02/23/16 1230

## 2016-02-23 NOTE — Sedation Documentation (Signed)
Laceration repair completed.

## 2016-02-23 NOTE — ED Notes (Signed)
Telephone consent given by Veronica Hood, pt son, for conscious sedation for dislocated right shoulder. Verified by Enid Derryhristy Edwards RN.

## 2016-02-23 NOTE — ED Notes (Signed)
Pt fell at assisted living home this am. Pt has laceration above right eye and deformity to right shoulder. Pt is confused and agitated upon arrival to ED.

## 2016-02-23 NOTE — ED Notes (Addendum)
Pt resting at time of rounding. Pt moving all extremities. Pt alert and looking around. nad noted. VSS. EDP aware, reported would assess pt for discharge.

## 2016-02-23 NOTE — ED Notes (Signed)
Pt opened eyes at last rounding. Intermittent apnea still noted.

## 2016-02-23 NOTE — ED Notes (Signed)
Report given to [facility] at [facility]. D/C transportation to be provided by facility.

## 2016-02-23 NOTE — ED Triage Notes (Signed)
[facility]. Pt has laceration to right side of face and moans in pain when right shoulder is touched

## 2016-02-23 NOTE — Discharge Instructions (Signed)
Strict fall precautions.  Suture removal in 5-7 days.  Remove sling after 48 hours. Slowly increase use of upper extremity.

## 2016-03-20 DEATH — deceased

## 2017-07-01 IMAGING — CT CT HEAD W/O CM
5 of 16 series · 12 of 47 positions shown, 13 images · non-contrast
Comparison: Head CT and cervical spine CT 02/14/2016.

CLINICAL DATA: 86-year-old female with history of trauma from a
fall.

EXAM:
CT HEAD WITHOUT CONTRAST
CT CERVICAL SPINE WITHOUT CONTRAST
TECHNIQUE: Multidetector CT imaging of the head and cervical spine was
performed following the standard protocol without intravenous
contrast. Multiplanar CT image reconstructions of the cervical spine
were also generated.

[Series 4: head bone · axial · 0.43mm/px · z∈[+1392,+1444]mm · 2 of 80 slices shown (1 of 2)]
[im 27/80  bone]
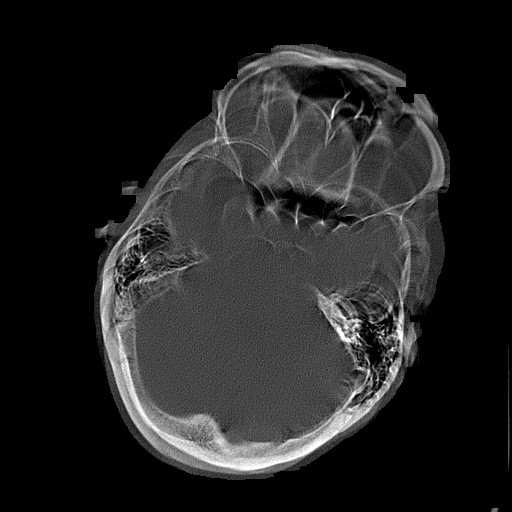
[im 53/80  bone]
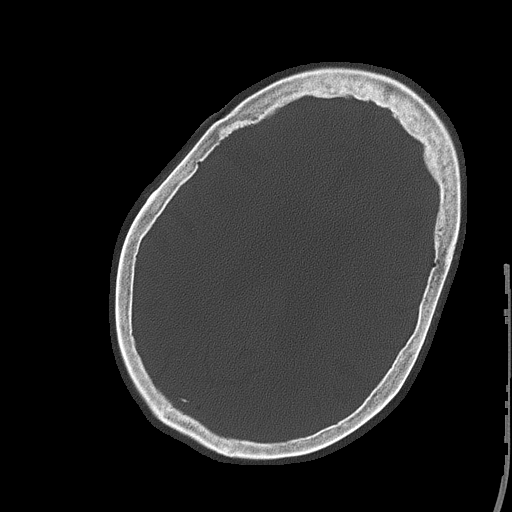

[Series 5: head bone · axial · 0.43mm/px · z∈[+1392,+1444]mm · 2 of 80 slices shown (2 of 2)]
[im 27/80  bone]
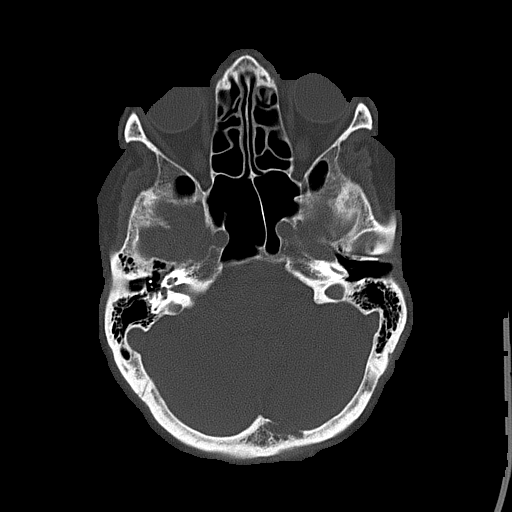
[im 53/80  bone]
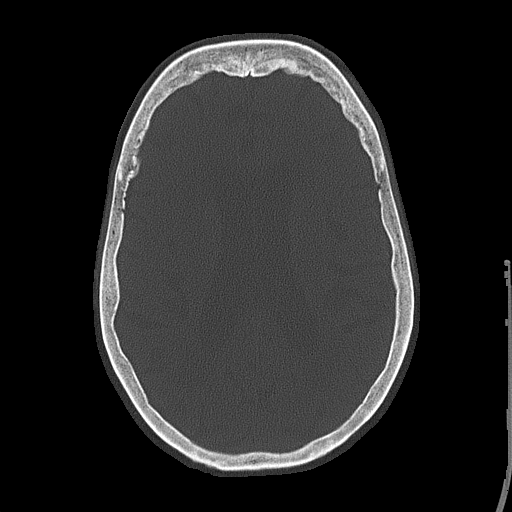

[Series 6: coronal · coronal · 0.28mm/px · 2 of 65 slices shown]
[im 22/65  brain]
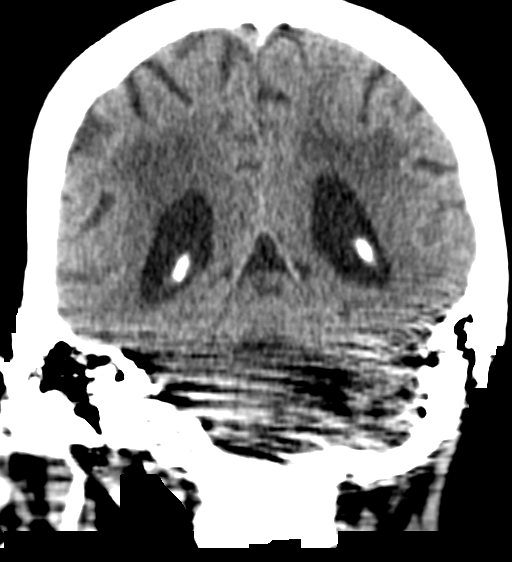
[im 43/65  brain]
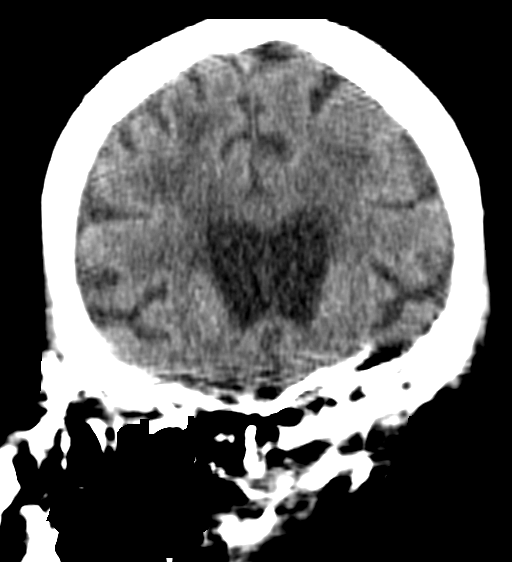

[Series 18: orthogonal axial · axial · 0.22mm/px · z∈[+1237,+1313]mm · 3 of 90 slices shown, 4 images (1 of 2)]
[im 23/90  brain]
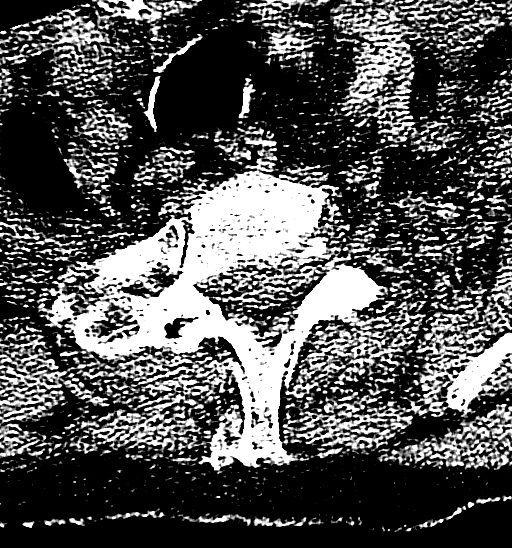
[im 23/90  bone]
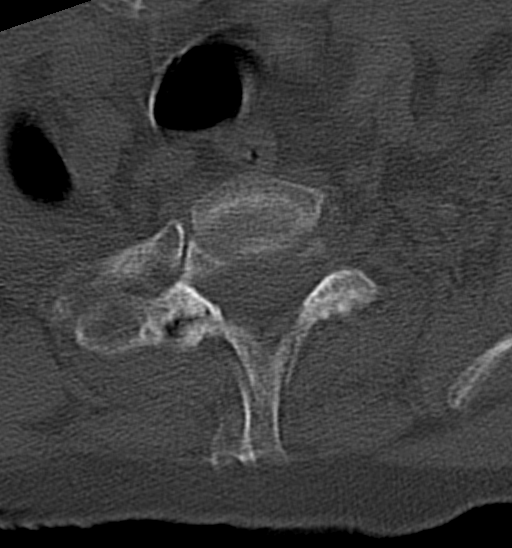
[im 45/90  brain]
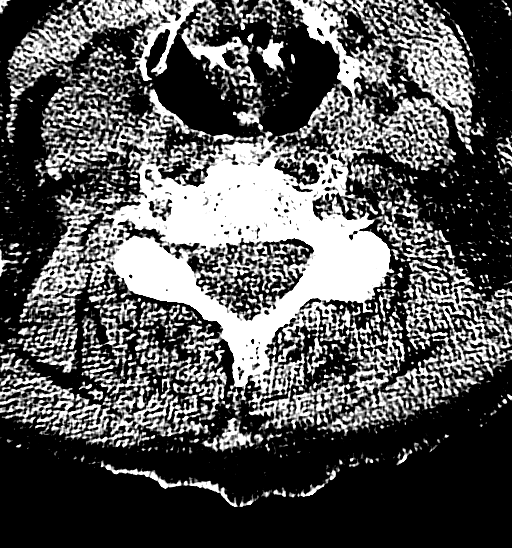
[im 67/90  brain]
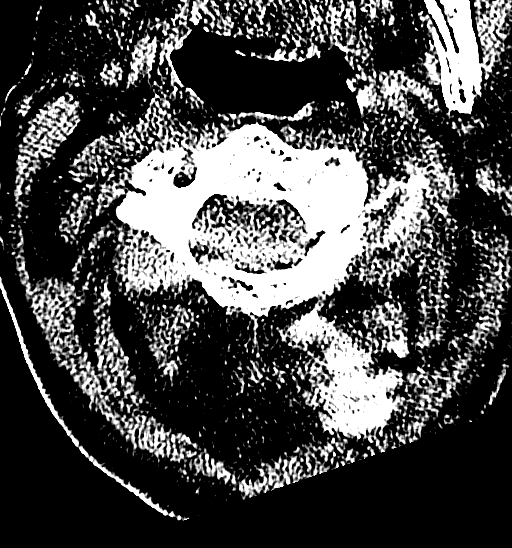

[Series 19: orthogonal axial · axial · 0.19mm/px · z∈[+1240,+1308]mm · 3 of 85 slices shown (2 of 2)]
[im 22/85  brain]
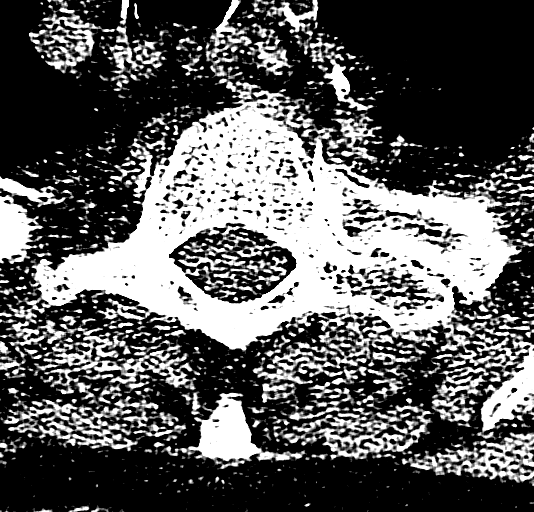
[im 43/85  brain]
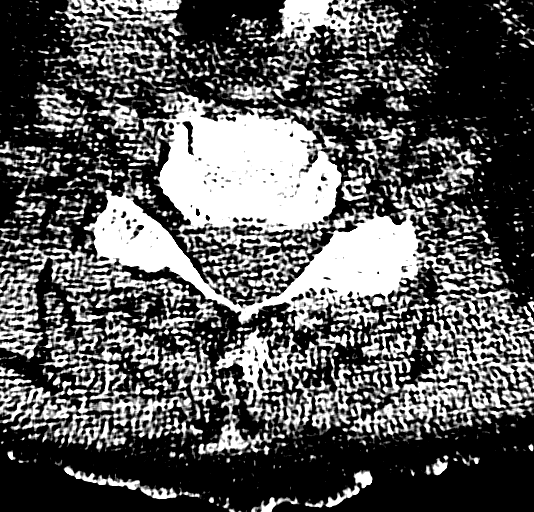
[im 64/85  brain]
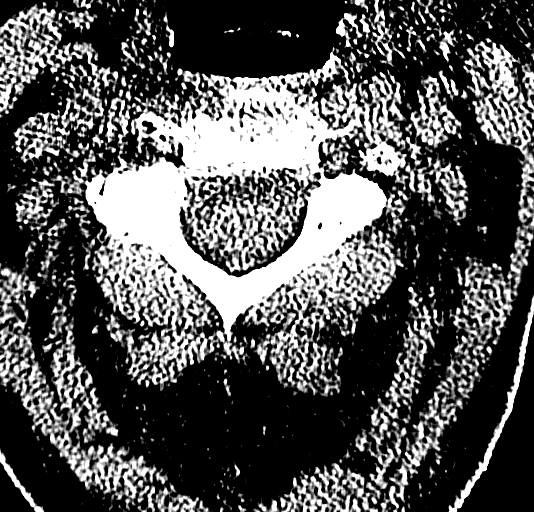

[12 of 47 positions shown; findings below may reference images not displayed]

FINDINGS: CT HEAD FINDINGS

Mild cerebral atrophy. Patchy and confluent areas of decreased
attenuation are noted throughout the deep and periventricular white
matter of the cerebral hemispheres bilaterally, compatible with
chronic microvascular ischemic disease. No acute displaced skull
fractures are identified. No acute intracranial abnormality.
Specifically, no evidence of acute post-traumatic intracranial
hemorrhage, no definite regions of acute/subacute cerebral ischemia,
no focal mass, mass effect, hydrocephalus or abnormal intra or
extra-axial fluid collections. The visualized paranasal sinuses and
mastoids are well pneumatized.

CT CERVICAL SPINE FINDINGS

Study is limited by considerable patient motion, despite repeat
examination. With these limitations in mind, the previously
described oblique lucency through the left side of the C6 vertebral
body is again noted, and apparent on multiple adjacent images
(sagittal series 15 image [id number]). Notably, this lucency was not
evident on more remote prior cervical spine CT 02/12/2016, which was
not compromised by motion. Unfortunately, secondary to motion, a
fracture cannot be confirmed on axial or orthogonal images. No other
potential displaced cervical spine fractures are noted. There is
some reversal of normal cervical lordosis centered at C5, presumably
positional. 3 mm of anterolisthesis of C3 upon C4. Multilevel
degenerative disc disease, most severe at C6-C7. Severe multilevel
facet arthropathy. Visualized portions of the upper thorax
demonstrate emphysematous changes and atherosclerosis in the
thoracic aorta.
IMPRESSION: 1. Evaluation of the cervical spine is again limited by motion, but
there is a persistent oblique lucency through the left lateral
aspect of C6 vertebral body concerning for (but not definitive for)
a nondisplaced fracture. This is very similar to the prior
examination from 02/14/2016.
2. No new fracture identified.
3. Multilevel degenerative disc disease and cervical spondylosis, as
above, similar to prior examinations.
4. No evidence of significant acute traumatic injury to the skull or
brain.
5. Cerebral atrophy with severe chronic microvascular ischemic
changes in the cerebral white matter, as above.
These results were called by telephone at the time of interpretation
on 02/18/2016 at [DATE] to Dr. VIVALDO DOS SANTOS MOITSE, who verbally
acknowledged these results.

## 2017-07-06 IMAGING — DX DG SHOULDER 2+V*R*
2 series · 2 of 2 positions shown · non-contrast
Comparison: None.

CLINICAL DATA: Pain following fall

EXAM:
RIGHT SHOULDER - 2+ VIEW

[shoulder grashey]
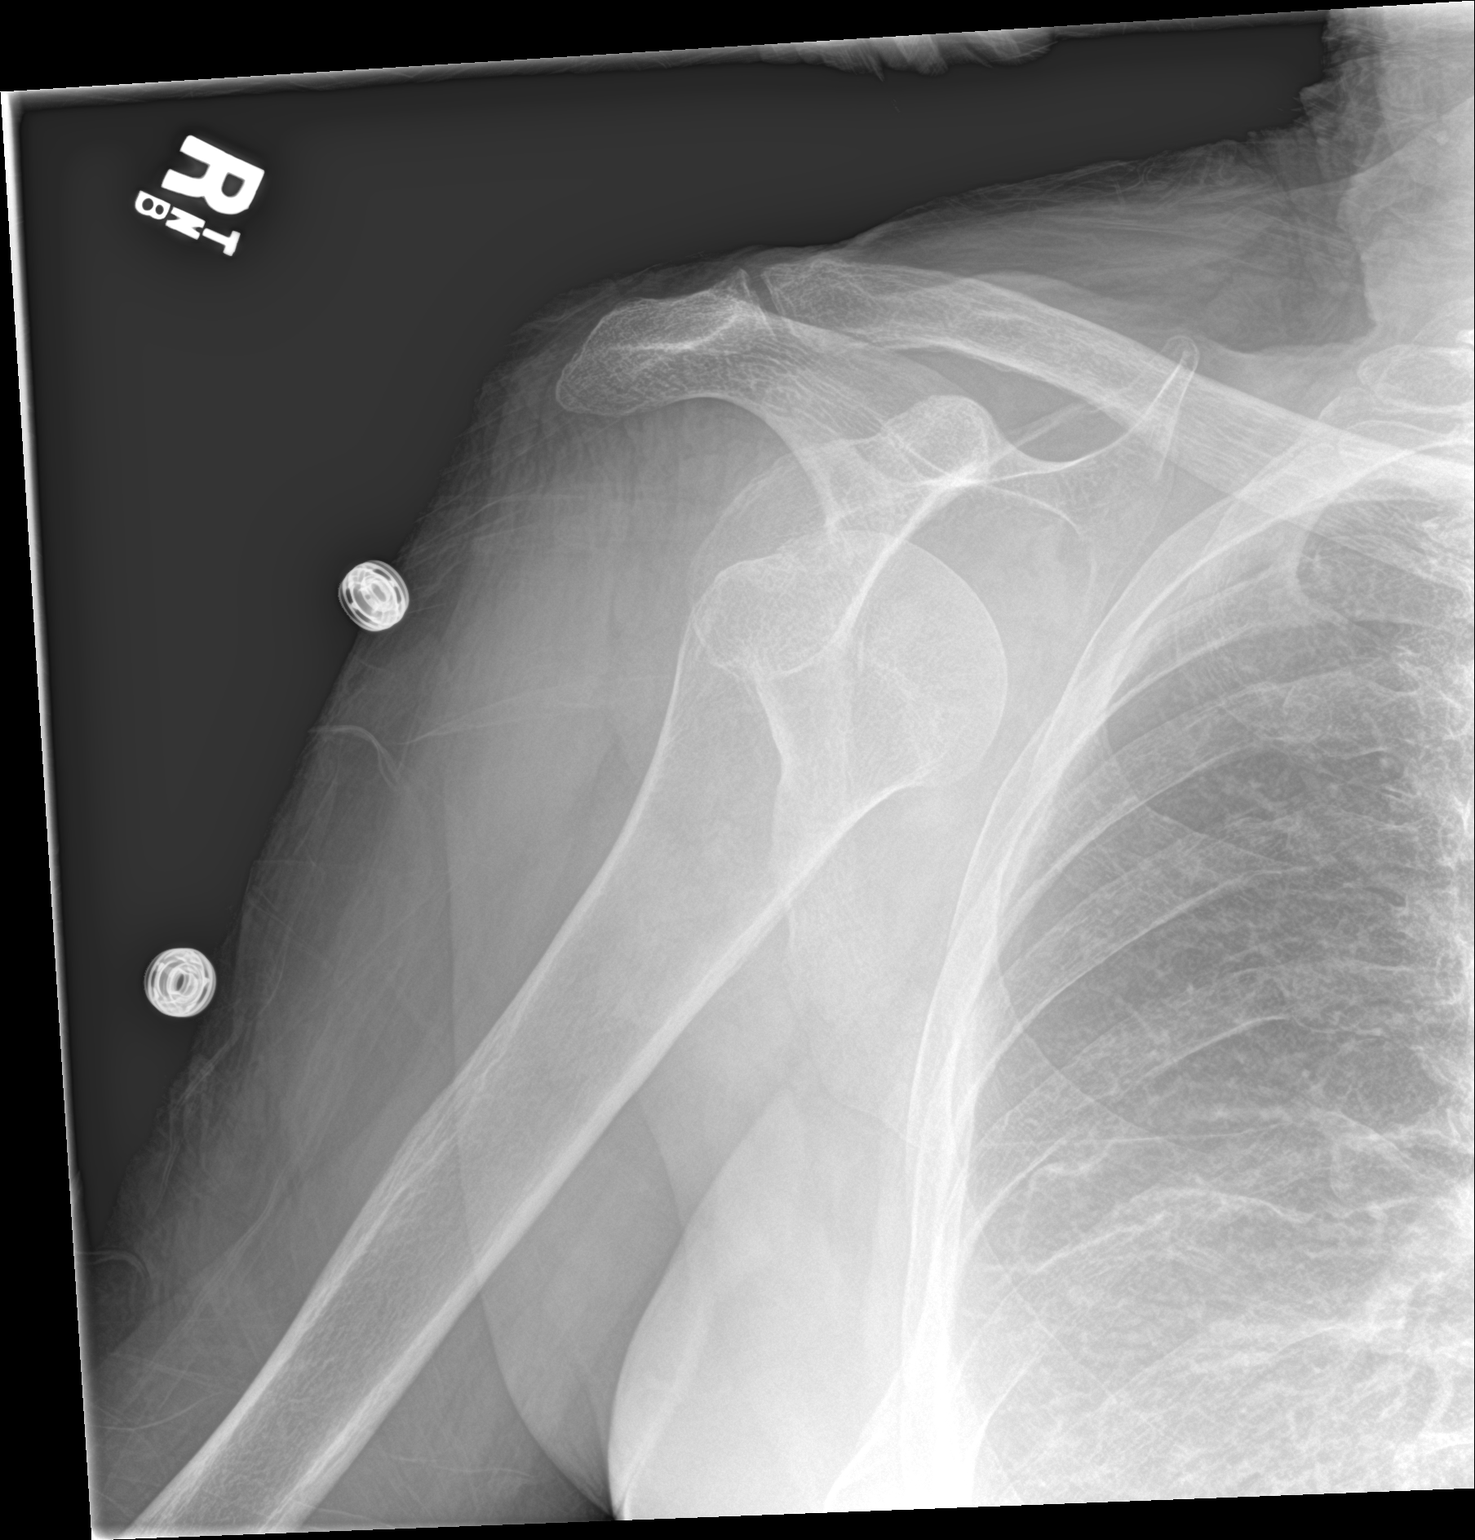

[shoulder y view]
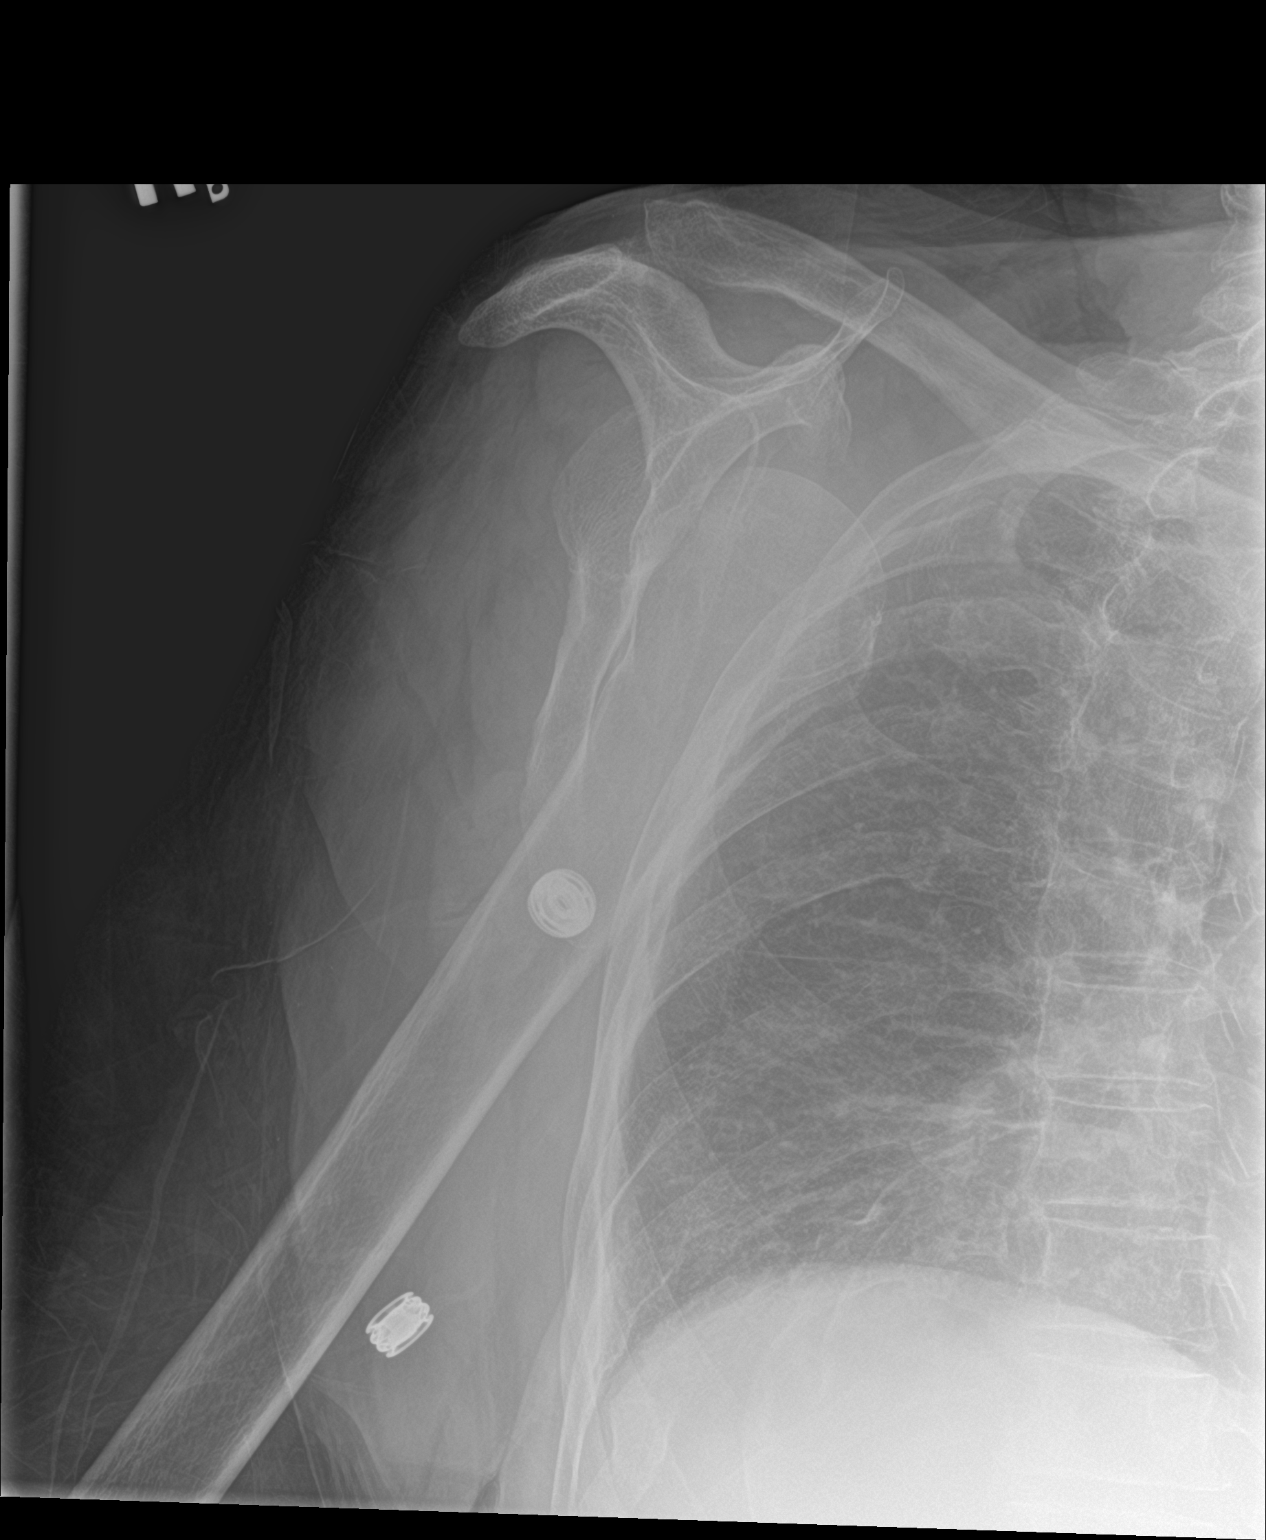

[2 of 2 positions shown; findings below may reference images not displayed]

FINDINGS: Frontal and Y scapular images were obtained. There is a subcoracoid
anterior dislocation. No fracture evident. There is mild
osteoarthritic change in the acromioclavicular joint. Visualized
right lung is clear.
IMPRESSION: Subcoracoid anterior dislocation.  No fracture appreciable.
